# Patient Record
Sex: Male | Born: 1955
Health system: Southern US, Community
[De-identification: ages and names within clinical notes are randomized; demographics above are authoritative.]

## PROBLEM LIST (undated history)

## (undated) DIAGNOSIS — E785 Hyperlipidemia, unspecified: Secondary | ICD-10-CM

## (undated) DIAGNOSIS — D649 Anemia, unspecified: Secondary | ICD-10-CM

## (undated) DIAGNOSIS — E079 Disorder of thyroid, unspecified: Secondary | ICD-10-CM

## (undated) DIAGNOSIS — E119 Type 2 diabetes mellitus without complications: Secondary | ICD-10-CM

## (undated) DIAGNOSIS — IMO0002 Reserved for concepts with insufficient information to code with codable children: Secondary | ICD-10-CM

## (undated) DIAGNOSIS — I10 Essential (primary) hypertension: Secondary | ICD-10-CM

## (undated) DIAGNOSIS — I639 Cerebral infarction, unspecified: Secondary | ICD-10-CM

## (undated) DIAGNOSIS — K219 Gastro-esophageal reflux disease without esophagitis: Secondary | ICD-10-CM

## (undated) DIAGNOSIS — J45909 Unspecified asthma, uncomplicated: Secondary | ICD-10-CM

## (undated) DIAGNOSIS — M1990 Unspecified osteoarthritis, unspecified site: Secondary | ICD-10-CM

## (undated) HISTORY — PX: ELBOW FRACTURE SURGERY: SHX616

## (undated) HISTORY — DX: Unspecified osteoarthritis, unspecified site: M19.90

## (undated) HISTORY — DX: Disorder of thyroid, unspecified: E07.9

## (undated) HISTORY — DX: Gastro-esophageal reflux disease without esophagitis: K21.9

## (undated) HISTORY — DX: Anemia, unspecified: D64.9

## (undated) HISTORY — PX: HERNIA REPAIR: SHX51

## (undated) HISTORY — DX: Reserved for concepts with insufficient information to code with codable children: IMO0002

## (undated) HISTORY — DX: Unspecified asthma, uncomplicated: J45.909

## (undated) HISTORY — PX: JOINT REPLACEMENT: SHX530

---

## 2016-02-13 ENCOUNTER — Emergency Department (INDEPENDENT_AMBULATORY_CARE_PROVIDER_SITE_OTHER)
Admission: EM | Admit: 2016-02-13 | Discharge: 2016-02-13 | Disposition: A | Discharge status: Home / Self Care | Payer: BLUE CROSS/BLUE SHIELD | Source: Home / Self Care | Attending: Family Medicine | Admitting: Family Medicine

## 2016-02-13 ENCOUNTER — Emergency Department (INDEPENDENT_AMBULATORY_CARE_PROVIDER_SITE_OTHER): Payer: BLUE CROSS/BLUE SHIELD

## 2016-02-13 ENCOUNTER — Encounter: Payer: Self-pay | Admitting: *Deleted

## 2016-02-13 DIAGNOSIS — M25522 Pain in left elbow: Secondary | ICD-10-CM

## 2016-02-13 DIAGNOSIS — S5002XA Contusion of left elbow, initial encounter: Secondary | ICD-10-CM

## 2016-02-13 HISTORY — DX: Type 2 diabetes mellitus without complications: E11.9

## 2016-02-13 HISTORY — DX: Essential (primary) hypertension: I10

## 2016-02-13 HISTORY — DX: Cerebral infarction, unspecified: I63.9

## 2016-02-13 HISTORY — DX: Hyperlipidemia, unspecified: E78.5

## 2016-02-13 NOTE — Discharge Instructions (Signed)
Apply ice pack for 20 to 30 minutes, 3 to 4 times daily  Continue until pain decreases.  Elevate arm.  Wear ace wrap until swelling resolves.  May take Ibuprofen 200mg , 4 tabs every 8 hours with food.    Elbow Contusion An elbow contusion is a deep bruise of the elbow. Contusions are the result of an injury that caused bleeding under the skin. The contusion may turn blue, purple, or yellow. Minor injuries will give you a painless contusion, but more severe contusions may stay painful and swollen for a few weeks.  CAUSES  An elbow contusion comes from a direct force to that area, such as falling on the elbow. SYMPTOMS   Swelling and redness of the elbow.  Bruising of the elbow area.  Tenderness or soreness of the elbow. DIAGNOSIS  You will have a physical exam and will be asked about your history. You may need an X-ray of your elbow to look for a broken bone (fracture).  TREATMENT  A sling or splint may be needed to support your injury. Resting, elevating, and applying cold compresses to the elbow area are often the best treatments for an elbow contusion. Over-the-counter medicines may also be recommended for pain control. HOME CARE INSTRUCTIONS   Put ice on the injured area.  Put ice in a plastic bag.  Place a towel between your skin and the bag.  Leave the ice on for 15-20 minutes, 03-04 times a day.  Only take over-the-counter or prescription medicines for pain, discomfort, or fever as directed by your caregiver.  Rest your injured elbow until the pain and swelling are better.  Elevate your elbow to reduce swelling.  Apply a compression wrap as directed by your caregiver. This can help reduce swelling and motion. You may remove the wrap for sleeping, showers, and baths. If your fingers become numb, cold, or blue, take the wrap off and reapply it more loosely.  Use your elbow only as directed by your caregiver. You may be asked to do range of motion exercises. Do them as  directed.  See your caregiver as directed. It is very important to keep all follow-up appointments in order to avoid any long-term problems with your elbow, including chronic pain or inability to move your elbow normally. SEEK IMMEDIATE MEDICAL CARE IF:   You have increased redness, swelling, or pain in your elbow.  Your swelling or pain is not relieved with medicines.  You have swelling of the hand and fingers.  You are unable to move your fingers or wrist.  You begin to lose feeling in your hand or fingers.  Your fingers or hand become cold or blue. MAKE SURE YOU:   Understand these instructions.  Will watch your condition.  Will get help right away if you are not doing well or get worse.   This information is not intended to replace advice given to you by your health care provider. Make sure you discuss any questions you have with your health care provider.   Document Released: 08/19/2006 Document Revised: 12/03/2011 Document Reviewed: 04/25/2015 Elsevier Interactive Patient Education [employer]2016 [employer] Inc.

## 2016-02-13 NOTE — ED Provider Notes (Signed)
CSN: [id number]     Arrival date & time 02/13/16  1126 History   First MD Initiated Contact with Patient 02/13/16 1157     Chief Complaint  Patient presents with  . 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 This morning at 6:35am he slipped on water, landing on his left posterior elbow and back.  He had no immediate pain, but has now developed swelling over his posterior left elbow.  Patient is a 60 y.o. male presenting with arm injury. The history is provided by the patient and the spouse.  Arm Injury Location:  Elbow Time since incident:  5 hours Injury: yes   Mechanism of injury: fall   Fall:    Fall occurred: slipped in water.   Impact surface:  Hard floor   Point of impact: left posterior elbow. Elbow location:  L elbow Pain details:    Quality:  Aching   Radiates to:  Does not radiate   Severity:  Mild   Onset quality:  Sudden   Duration:  5 hours   Timing:  Constant   Progression:  Worsening Chronicity:  New Handedness:  Right-handed Dislocation: no   Prior injury to area:  Yes Relieved by:  Nothing Worsened by:  Movement Ineffective treatments:  None tried Associated symptoms: stiffness and swelling   Associated symptoms: no back pain, no decreased range of motion, no muscle weakness, no numbness and no tingling     Past Medical History  Diagnosis Date  . Hypertension   . Diabetes mellitus without complication (HCC)   . Stroke (HCC)   . Hyperlipidemia    Past Surgical History  Procedure Laterality Date  . Hernia repair    . Elbow fracture surgery     Family History  Problem Relation Age of Onset  . Cancer Father     lung CA   Social History  Substance Use Topics  . Smoking status: Never Smoker   . Smokeless tobacco: Never Used  . Alcohol Use: No    Review of Systems  Musculoskeletal: Positive for stiffness. Negative for back pain.  All other systems reviewed  and are negative.   Allergies  Review of patient's allergies indicates no known allergies.  Home Medications   Prior to Admission medications   Medication Sig Start Date End Date Taking? Authorizing Provider  esomeprazole (NEXIUM) 40 MG capsule Take 40 mg by mouth daily at 12 noon.   Yes Historical Provider, MD  levothyroxine (SYNTHROID, LEVOTHROID) 75 MCG tablet Take 75 mcg by mouth daily before breakfast.   Yes Historical Provider, MD  ramipril (ALTACE) 10 MG capsule Take 10 mg by mouth daily.   Yes Historical Provider, MD  simvastatin (ZOCOR) 40 MG tablet Take 40 mg by mouth daily.   Yes Historical Provider, MD  triamterene-hydrochlorothiazide (DYAZIDE) 37.5-25 MG capsule Take 1 capsule by mouth daily.   Yes Historical Provider, MD   Meds Ordered and Administered this Visit  Medications - No data to display  BP 120/79 mmHg  Pulse 81  Ht 5' 9.5" (1.765 m)  Wt 256 lb (116.121 kg)  BMI 37.28 kg/m2  SpO2 98% No data found.   Physical Exam  Constitutional: He is oriented to person, place, and time. He appears well-developed and well-nourished. No distress.  HENT:  Head: Normocephalic.  Mouth/Throat: Oropharynx is clear and moist.  Eyes: Conjunctivae are normal. Pupils are equal, round, and  reactive to light.  Musculoskeletal: Normal range of motion.       Left elbow: He exhibits swelling. He exhibits normal range of motion, no effusion, no deformity and no laceration. Tenderness found. Olecranon process tenderness noted.       Arms: There is swelling and tenderness to palpation over the left olecranon bursa.  Elbow has good range of motion. No tenderness to palpation or swelling over the radial head.  Distal neurovascular function is intact.   Neurological: He is alert and oriented to person, place, and time.  Skin: Skin is warm and dry.  Nursing note and vitals reviewed.   ED Course  Procedures none   Imaging Review Dg Elbow Complete Left  02/13/2016  CLINICAL DATA:   C/o LT posterior elbow pain and swelling after fall onto floor this morning. Hx LT elbow fx 2002. EXAM: LEFT ELBOW - COMPLETE 3+ VIEW COMPARISON:  None. FINDINGS: There is no evidence of fracture, dislocation, or joint effusion. Elbow joint replacement with prosthetic radial head and neck noted. Minimal medial compartment arthropathy. Soft tissues are unremarkable. IMPRESSION: No acute findings Electronically Signed   By: Esperanza Heiraymond  Rubner M.D.   On: 02/13/2016 12:24      MDM   1. Contusion, elbow, left, initial encounter     Apply ice pack for 20 to 30 minutes, 3 to 4 times daily  Continue until pain decreases.  Elevate arm.  Wear ace wrap until swelling resolves.  May take Ibuprofen 200mg , 4 tabs every 8 hours with food.  Followup with Dr. Rodney Langtonhomas Thekkekandam or Dr. Clementeen GrahamEvan Corey ([facility]) if not improving about two weeks.     Lattie HawStephen A Beese, MD 02/13/16 1310

## 2016-02-13 NOTE — ED Notes (Signed)
Pt fell @ home this AM, slipped on water, landing on his back and left elbow. Back currently doesn't hurt, left elbow swelling and pain. Previous injury to left elbow with prosthetic joint.

## 2018-09-02 DIAGNOSIS — Z794 Long term (current) use of insulin: Secondary | ICD-10-CM | POA: Insufficient documentation

## 2018-09-02 DIAGNOSIS — E119 Type 2 diabetes mellitus without complications: Secondary | ICD-10-CM | POA: Insufficient documentation

## 2018-09-08 DIAGNOSIS — E039 Hypothyroidism, unspecified: Secondary | ICD-10-CM | POA: Insufficient documentation

## 2018-09-08 DIAGNOSIS — I10 Essential (primary) hypertension: Secondary | ICD-10-CM | POA: Insufficient documentation

## 2018-09-19 ENCOUNTER — Encounter (HOSPITAL_BASED_OUTPATIENT_CLINIC_OR_DEPARTMENT_OTHER): Payer: Self-pay | Admitting: *Deleted

## 2018-09-19 ENCOUNTER — Emergency Department (HOSPITAL_BASED_OUTPATIENT_CLINIC_OR_DEPARTMENT_OTHER)
Admission: EM | Admit: 2018-09-19 | Discharge: 2018-09-19 | Disposition: A | Discharge status: Home / Self Care | Payer: BLUE CROSS/BLUE SHIELD | Attending: Emergency Medicine | Admitting: Emergency Medicine

## 2018-09-19 ENCOUNTER — Other Ambulatory Visit: Payer: Self-pay

## 2018-09-19 DIAGNOSIS — E119 Type 2 diabetes mellitus without complications: Secondary | ICD-10-CM | POA: Diagnosis not present

## 2018-09-19 DIAGNOSIS — Z79899 Other long term (current) drug therapy: Secondary | ICD-10-CM | POA: Diagnosis not present

## 2018-09-19 DIAGNOSIS — E785 Hyperlipidemia, unspecified: Secondary | ICD-10-CM | POA: Insufficient documentation

## 2018-09-19 DIAGNOSIS — R29810 Facial weakness: Secondary | ICD-10-CM | POA: Diagnosis present

## 2018-09-19 DIAGNOSIS — I10 Essential (primary) hypertension: Secondary | ICD-10-CM | POA: Insufficient documentation

## 2018-09-19 DIAGNOSIS — G510 Bell's palsy: Secondary | ICD-10-CM

## 2018-09-19 MED ORDER — VALACYCLOVIR HCL 500 MG PO TABS: 1000.0000 mg | ORAL_TABLET | Freq: Three times a day (TID) | ORAL | 0 refills | Status: AC

## 2018-09-19 MED ORDER — PREDNISONE 20 MG PO TABS: 60.0000 mg | ORAL_TABLET | Freq: Every day | ORAL | 0 refills | Status: DC

## 2018-09-19 MED ORDER — PREDNISONE 20 MG PO TABS: 60.0000 mg | ORAL_TABLET | Freq: Every day | ORAL | 0 refills | Status: AC

## 2018-09-19 MED ORDER — VALACYCLOVIR HCL 500 MG PO TABS: 1000.0000 mg | ORAL_TABLET | Freq: Three times a day (TID) | ORAL | 0 refills | Status: DC

## 2018-09-19 MED ORDER — PREDNISONE 50 MG PO TABS
60.0000 mg | ORAL_TABLET | Freq: Once | ORAL | Status: AC
  Administered 2018-09-19: 60 mg via ORAL
  Filled 2018-09-19: qty 1

## 2018-09-19 MED ORDER — NAPHAZOLINE-GLYCERIN 0.012-0.2 % OP SOLN
2.0000 [drp] | Freq: Four times a day (QID) | OPHTHALMIC | Status: DC | PRN
  Filled 2018-09-19: qty 15

## 2018-09-19 MED FILL — predniSONE 20 MG TABS: 20 | 7 days supply | Qty: 21 | Fill #0

## 2018-09-19 MED FILL — VALACYCLOVIR HCL 500 MG TAB: 500 | 5 days supply | Qty: 30 | Fill #0

## 2018-09-19 NOTE — ED Notes (Signed)
ED Provider at bedside. 

## 2018-09-19 NOTE — ED Triage Notes (Signed)
Unable to shut his left eye. Water dribbles out the right side of his mouth when he drinks. Recent cold. Symptoms started yesterday evening starting with left eye watering.

## 2018-09-19 NOTE — ED Notes (Signed)
Pt reports his left eye is watering constantly, denies blurred vision. Pt also reports left side of his mouth feels numb. Pt reports difficulty holding fluids in his mouth. Pt describes event "like I was at the dentist and received numbing medication". Pt neuro intact. Pt denies hx of bells palsy.

## 2018-09-19 NOTE — ED Notes (Signed)
Pt given eye patch for comfort

## 2018-09-19 NOTE — ED Provider Notes (Signed)
[facility] EMERGENCY DEPARTMENT Provider Note   CSN: [id number] Arrival date & time: 09/19/18  1319     History   Chief Complaint Chief Complaint  Patient presents with  . Facial numbness    HPI Oscar Gallagher is a 62 y.o. male.  Presents with 1 day of facial numbness and difficulty closing his eye.  Started last night when patient was sitting on his couch.  Patient recently had URI and he was getting over a cold.  Patient states that feels like "Novocain" after dental procedure in his mouth.  He is unable to hold liquids in his mouth when drinking.  He has difficulty closing his left eye.  Patient denies any type of sudden onset headache or other weakness in the area.  Patient has has past medical history significant for diabetes, hyperlipidemia, hypertension.  Patient also endorses some pain along the back of the neck.  Denies any trauma.  Denies any rash throughout his body.  Denies any chest pain, shortness of breath, vision changes, headache, fever, chills.  Patient is never had shingles before.  The history is provided by the patient.    Past Medical History:  Diagnosis Date  . Diabetes mellitus without complication (HCC)   . Hyperlipidemia   . Hypertension   . Stroke [facility](HCC)     There are no active problems to display for this patient.   Past Surgical History:  Procedure Laterality Date  . ELBOW FRACTURE SURGERY    . HERNIA REPAIR          Home Medications    Prior to Admission medications   Medication Sig Start Date End Date Taking? Authorizing Provider  esomeprazole (NEXIUM) 40 MG capsule Take 40 mg by mouth daily at 12 noon.   Yes [provider]  levothyroxine (SYNTHROID, LEVOTHROID) 75 MCG tablet Take 75 mcg by mouth daily before breakfast.   Yes [provider]  ramipril (ALTACE) 10 MG capsule Take 10 mg by mouth daily.   Yes [provider]  simvastatin (ZOCOR) 40 MG tablet Take 40 mg by mouth daily.   Yes [provider]  triamterene-hydrochlorothiazide (DYAZIDE) 37.5-25 MG capsule Take 1 capsule by mouth daily.   Yes [provider]  predniSONE (DELTASONE) 20 MG tablet Take 3 tablets (60 mg total) by mouth daily with breakfast for 7 days. 09/19/18 09/26/18  Garnette Gunnerhompson,  B, MD  valACYclovir (VALTREX) 500 MG tablet Take 2 tablets (1,000 mg total) by mouth 3 (three) times daily for 7 days. 09/19/18 09/26/18  Garnette Gunnerhompson,  B, MD    Family History Family History  Problem Relation Age of Onset  . Cancer Father        lung CA    Social History Social History   Tobacco Use  . Smoking status: Never Smoker  . Smokeless tobacco: Never Used  Substance Use Topics  . Alcohol use: No  . Drug use: No     Allergies   Patient has no known allergies.   Review of Systems Review of Systems As per HPI  Physical Exam Updated Vital Signs BP (!) 151/82 (BP Location: Right Arm)   Pulse 98   Temp 98.2 F (36.8 C) (Oral)   Resp 18   Ht 5' 9.5" (1.765 m)   Wt 110.7 kg   SpO2 98%   BMI 35.52 kg/m   Physical Exam Pulmonary:     Effort: Pulmonary effort is normal.     Breath sounds: Normal breath sounds.  Abdominal:  General: Abdomen is flat.     Palpations: Abdomen is soft.  Musculoskeletal:     Comments: 5 of 5 strength in upper and lower extremities.  Denies any decreased sensation in this as well.  Neurological:     Mental Status: He is alert and oriented to person, place, and time. Mental status is at baseline.     Comments: Patient has t reduced ability to lift left eyebrow and close left eye.  Also feels numbness inside his cheek.  Remainder of cranial nerves are intact.      ED Treatments / Results  Labs (all labs ordered are listed, but only abnormal results are displayed) Labs Reviewed - No data to display  EKG None  Radiology No results found.  Procedures Procedures (including critical care time)  Medications Ordered in ED Medications    naphazoline-glycerin (CLEAR EYES REDNESS) ophth solution 2 drop (has no administration in time range)  predniSONE (DELTASONE) tablet 60 mg (60 mg Oral Given 09/19/18 1400)     Initial Impression / Assessment and Plan / ED Course  I have reviewed the triage vital signs and the nursing notes.  Pertinent labs & imaging results that were available during my care of the patient were reviewed by me and considered in my medical decision making (see chart for details).    Patient presenting with sudden onset focal weakness of the left eyelid and some anterior facial numbness in the setting of recent viral URI.  Patient was likely has Bell's palsy.  No other cranial nerve defects seen.  Will treat with 1 week course of prednisone and valacyclovir.   Final Clinical Impressions(s) / ED Diagnoses   Final diagnoses:  Bell's palsy    ED Discharge Orders         Ordered    valACYclovir (VALTREX) 500 MG tablet  3 times daily     09/19/18 1415    predniSONE (DELTASONE) 20 MG tablet  Daily with breakfast     09/19/18 1415           Garnette Gunnerhompson,  B, MD 09/19/18 1540    Gwyneth SproutPlunkett, Whitney, MD 09/19/18 1603

## 2018-11-16 ENCOUNTER — Encounter (HOSPITAL_BASED_OUTPATIENT_CLINIC_OR_DEPARTMENT_OTHER): Payer: Self-pay | Admitting: Emergency Medicine

## 2018-11-16 ENCOUNTER — Other Ambulatory Visit: Payer: Self-pay

## 2018-11-16 ENCOUNTER — Emergency Department (HOSPITAL_BASED_OUTPATIENT_CLINIC_OR_DEPARTMENT_OTHER)
Admission: EM | Admit: 2018-11-16 | Discharge: 2018-11-16 | Disposition: A | Discharge status: Home / Self Care | Payer: BLUE CROSS/BLUE SHIELD | Attending: Emergency Medicine | Admitting: Emergency Medicine

## 2018-11-16 DIAGNOSIS — M79641 Pain in right hand: Secondary | ICD-10-CM | POA: Diagnosis not present

## 2018-11-16 DIAGNOSIS — M79642 Pain in left hand: Secondary | ICD-10-CM | POA: Diagnosis not present

## 2018-11-16 DIAGNOSIS — Z5321 Procedure and treatment not carried out due to patient leaving prior to being seen by health care provider: Secondary | ICD-10-CM | POA: Diagnosis not present

## 2018-11-16 LAB — CBG MONITORING, ED: Glucose-Capillary: 242 mg/dL — ABNORMAL HIGH (ref 70–99)

## 2018-11-16 NOTE — ED Notes (Signed)
Pt approached nurse first desk stating " I can't take no more". Pt reassured that rooms would be available soon. Pt stated "yes, but then I would have to go back there and wait another hour". Pt states he still desires to LWBS.

## 2018-11-16 NOTE — ED Triage Notes (Signed)
Bilateral hand pain x 4 weeks.

## 2018-11-21 DIAGNOSIS — M5412 Radiculopathy, cervical region: Secondary | ICD-10-CM | POA: Insufficient documentation

## 2019-02-10 HISTORY — PX: CARPAL TUNNEL RELEASE: SHX101

## 2019-07-13 DIAGNOSIS — E781 Pure hyperglyceridemia: Secondary | ICD-10-CM | POA: Insufficient documentation

## 2019-11-15 ENCOUNTER — Encounter: Payer: Self-pay | Admitting: Emergency Medicine

## 2019-11-15 ENCOUNTER — Emergency Department (INDEPENDENT_AMBULATORY_CARE_PROVIDER_SITE_OTHER)
Admission: EM | Admit: 2019-11-15 | Discharge: 2019-11-15 | Disposition: A | Discharge status: Home / Self Care | Payer: BLUE CROSS/BLUE SHIELD | Source: Home / Self Care

## 2019-11-15 ENCOUNTER — Telehealth: Payer: Self-pay | Admitting: Emergency Medicine

## 2019-11-15 ENCOUNTER — Emergency Department (INDEPENDENT_AMBULATORY_CARE_PROVIDER_SITE_OTHER): Payer: BLUE CROSS/BLUE SHIELD

## 2019-11-15 ENCOUNTER — Other Ambulatory Visit: Payer: Self-pay

## 2019-11-15 DIAGNOSIS — S59901A Unspecified injury of right elbow, initial encounter: Secondary | ICD-10-CM | POA: Diagnosis not present

## 2019-11-15 DIAGNOSIS — S5001XA Contusion of right elbow, initial encounter: Secondary | ICD-10-CM

## 2019-11-15 DIAGNOSIS — M25521 Pain in right elbow: Secondary | ICD-10-CM

## 2019-11-15 NOTE — Telephone Encounter (Signed)
Patient informed of negative elbow x-ray per Dr Hyacinth Meeker.

## 2019-11-15 NOTE — ED Provider Notes (Signed)
[facility]    CSN: [id number] Arrival date & time: 11/15/19  1240      History   Chief Complaint Chief Complaint  Patient presents with  . Arm Pain    HPI Oscar Gallagher is a 64 y.o. male.   Patient fell about 6 weeks ago striking area around his right elbow.  Has had pain since then.  He comes in today wearing a wrap on splint which he says is helpful.  HPI  Past Medical History:  Diagnosis Date  . Diabetes mellitus without complication ([place])   . Hyperlipidemia   . Hypertension   . Stroke [facility])     There are no problems to display for this patient.   Past Surgical History:  Procedure Laterality Date  . ELBOW FRACTURE SURGERY    . HERNIA REPAIR         Home Medications    Prior to Admission medications   Medication Sig Start Date End Date Taking? Authorizing Provider  esomeprazole (NEXIUM) 40 MG capsule Take 40 mg by mouth daily at 12 noon.    [provider]  levothyroxine (SYNTHROID, LEVOTHROID) 75 MCG tablet Take 75 mcg by mouth daily before breakfast.    [provider]  ramipril (ALTACE) 10 MG capsule Take 10 mg by mouth daily.    [provider]  simvastatin (ZOCOR) 40 MG tablet Take 40 mg by mouth daily.    [provider]  triamterene-hydrochlorothiazide (DYAZIDE) 37.5-25 MG capsule Take 1 capsule by mouth daily.    [provider]    Family History Family History  Problem Relation Age of Onset  . Cancer Father        lung CA    Social History Social History   Tobacco Use  . Smoking status: Never Smoker  . Smokeless tobacco: Never Used  Substance Use Topics  . Alcohol use: No  . Drug use: No     Allergies   Patient has no known allergies.   Review of Systems Review of Systems  Musculoskeletal: Positive for arthralgias.       Right elbow pain  All other systems reviewed and are negative.    Physical Exam Triage Vital Signs ED Triage Vitals [11/15/19 1344]  Enc Vitals  Group     BP 134/79     Pulse Rate 84     Resp      Temp 98.9 F (37.2 C)     Temp Source Oral     SpO2 97 %     Weight 232 lb (105.2 kg)     Height 5' 9.5" (1.765 m)     Head Circumference      Peak Flow      Pain Score 8     Pain Loc      Pain Edu?      Excl. in [place]?    No data found.  Updated Vital Signs BP 134/79 (BP Location: Left Arm)   Pulse 84   Temp 98.9 F (37.2 C) (Oral)   Ht 5' 9.5" (1.765 m)   Wt 105.2 kg   SpO2 97%   BMI 33.77 kg/m   Visual Acuity Right Eye Distance:   Left Eye Distance:   Bilateral Distance:    Right Eye Near:   Left Eye Near:    Bilateral Near:     Physical Exam Vitals and nursing note reviewed.  Constitutional:      Appearance: Normal appearance. He is obese.  Musculoskeletal:  Comments: Right elbow: Normal pronation supination Normal flexion and extension There is tenderness over the medial epicondyle  Neurological:     Mental Status: He is alert.      UC Treatments / Results  Labs (all labs ordered are listed, but only abnormal results are displayed) Labs Reviewed - No data to display  EKG X-ray of the right elbow shows no fracture  Radiology No results found.  Procedures Procedures (including critical care time)  Medications Ordered in UC Medications - No data to display  Initial Impression / Assessment and Plan / UC Course  I have reviewed the triage vital signs and the nursing notes.  Pertinent labs & imaging results that were available during my care of the patient were reviewed by me and considered in my medical decision making (see chart for details).     Contusion right elbow.  With negative x-ray also consider traumatic bursitis or tendinitis around the medial epicondyle.  I suggested that he try NSAID of choice probably Aleve on a regular basis for several weeks. Final Clinical Impressions(s) / UC Diagnoses   Final diagnoses:  Elbow pain, right   Discharge Instructions   None    ED  Prescriptions    None     PDMP not reviewed this encounter.   Frederica Kuster, MD 11/15/19 1438

## 2019-11-15 NOTE — ED Triage Notes (Signed)
Patient fell about 6 weeks ago hurt his right elbow/arm, requesting x-ray.  Been applying arthritis cream and keeping arm wrapped up.

## 2020-01-20 DIAGNOSIS — Z794 Long term (current) use of insulin: Secondary | ICD-10-CM | POA: Diagnosis not present

## 2020-01-20 DIAGNOSIS — D649 Anemia, unspecified: Secondary | ICD-10-CM | POA: Diagnosis not present

## 2020-01-20 DIAGNOSIS — E1165 Type 2 diabetes mellitus with hyperglycemia: Secondary | ICD-10-CM | POA: Diagnosis not present

## 2020-01-27 DIAGNOSIS — E1165 Type 2 diabetes mellitus with hyperglycemia: Secondary | ICD-10-CM | POA: Diagnosis not present

## 2020-01-27 DIAGNOSIS — I10 Essential (primary) hypertension: Secondary | ICD-10-CM | POA: Diagnosis not present

## 2020-01-27 DIAGNOSIS — E785 Hyperlipidemia, unspecified: Secondary | ICD-10-CM | POA: Diagnosis not present

## 2020-01-27 DIAGNOSIS — E039 Hypothyroidism, unspecified: Secondary | ICD-10-CM | POA: Diagnosis not present

## 2020-03-15 DIAGNOSIS — M7701 Medial epicondylitis, right elbow: Secondary | ICD-10-CM | POA: Diagnosis not present

## 2020-03-15 DIAGNOSIS — H538 Other visual disturbances: Secondary | ICD-10-CM | POA: Diagnosis not present

## 2020-03-15 DIAGNOSIS — R519 Headache, unspecified: Secondary | ICD-10-CM | POA: Diagnosis not present

## 2020-06-02 DIAGNOSIS — E1165 Type 2 diabetes mellitus with hyperglycemia: Secondary | ICD-10-CM | POA: Diagnosis not present

## 2020-06-02 DIAGNOSIS — Z794 Long term (current) use of insulin: Secondary | ICD-10-CM | POA: Diagnosis not present

## 2020-06-07 DIAGNOSIS — E119 Type 2 diabetes mellitus without complications: Secondary | ICD-10-CM | POA: Diagnosis not present

## 2020-06-07 DIAGNOSIS — Z23 Encounter for immunization: Secondary | ICD-10-CM | POA: Diagnosis not present

## 2020-06-07 DIAGNOSIS — L578 Other skin changes due to chronic exposure to nonionizing radiation: Secondary | ICD-10-CM | POA: Insufficient documentation

## 2020-06-07 DIAGNOSIS — N529 Male erectile dysfunction, unspecified: Secondary | ICD-10-CM | POA: Diagnosis not present

## 2020-10-04 DIAGNOSIS — U071 COVID-19: Secondary | ICD-10-CM | POA: Diagnosis not present

## 2020-10-18 DIAGNOSIS — R3912 Poor urinary stream: Secondary | ICD-10-CM | POA: Diagnosis not present

## 2020-10-18 DIAGNOSIS — U071 COVID-19: Secondary | ICD-10-CM | POA: Diagnosis not present

## 2020-10-18 DIAGNOSIS — E119 Type 2 diabetes mellitus without complications: Secondary | ICD-10-CM | POA: Diagnosis not present

## 2020-10-18 DIAGNOSIS — N401 Enlarged prostate with lower urinary tract symptoms: Secondary | ICD-10-CM | POA: Diagnosis not present

## 2021-01-26 DIAGNOSIS — E1165 Type 2 diabetes mellitus with hyperglycemia: Secondary | ICD-10-CM | POA: Diagnosis not present

## 2021-01-26 DIAGNOSIS — E119 Type 2 diabetes mellitus without complications: Secondary | ICD-10-CM | POA: Diagnosis not present

## 2021-01-26 DIAGNOSIS — R5383 Other fatigue: Secondary | ICD-10-CM | POA: Diagnosis not present

## 2021-01-26 DIAGNOSIS — Z794 Long term (current) use of insulin: Secondary | ICD-10-CM | POA: Diagnosis not present

## 2021-01-26 DIAGNOSIS — Z125 Encounter for screening for malignant neoplasm of prostate: Secondary | ICD-10-CM | POA: Diagnosis not present

## 2021-01-26 DIAGNOSIS — D649 Anemia, unspecified: Secondary | ICD-10-CM | POA: Diagnosis not present

## 2021-01-26 DIAGNOSIS — E785 Hyperlipidemia, unspecified: Secondary | ICD-10-CM | POA: Diagnosis not present

## 2021-02-02 DIAGNOSIS — K219 Gastro-esophageal reflux disease without esophagitis: Secondary | ICD-10-CM | POA: Diagnosis not present

## 2021-02-02 DIAGNOSIS — E039 Hypothyroidism, unspecified: Secondary | ICD-10-CM | POA: Diagnosis not present

## 2021-02-02 DIAGNOSIS — E119 Type 2 diabetes mellitus without complications: Secondary | ICD-10-CM | POA: Diagnosis not present

## 2021-02-02 DIAGNOSIS — N529 Male erectile dysfunction, unspecified: Secondary | ICD-10-CM | POA: Diagnosis not present

## 2021-02-23 DIAGNOSIS — R7989 Other specified abnormal findings of blood chemistry: Secondary | ICD-10-CM | POA: Diagnosis not present

## 2021-03-16 DIAGNOSIS — R7989 Other specified abnormal findings of blood chemistry: Secondary | ICD-10-CM | POA: Diagnosis not present

## 2021-03-16 DIAGNOSIS — R399 Unspecified symptoms and signs involving the genitourinary system: Secondary | ICD-10-CM | POA: Insufficient documentation

## 2021-03-16 DIAGNOSIS — R591 Generalized enlarged lymph nodes: Secondary | ICD-10-CM | POA: Diagnosis not present

## 2021-06-11 ENCOUNTER — Emergency Department (HOSPITAL_BASED_OUTPATIENT_CLINIC_OR_DEPARTMENT_OTHER)
Admission: EM | Admit: 2021-06-11 | Discharge: 2021-06-11 | Disposition: A | Discharge status: Home / Self Care | Payer: BC Managed Care – PPO | Attending: Emergency Medicine | Admitting: Emergency Medicine

## 2021-06-11 ENCOUNTER — Encounter (HOSPITAL_BASED_OUTPATIENT_CLINIC_OR_DEPARTMENT_OTHER): Payer: Self-pay | Admitting: Emergency Medicine

## 2021-06-11 DIAGNOSIS — E119 Type 2 diabetes mellitus without complications: Secondary | ICD-10-CM | POA: Diagnosis not present

## 2021-06-11 DIAGNOSIS — I10 Essential (primary) hypertension: Secondary | ICD-10-CM | POA: Diagnosis not present

## 2021-06-11 DIAGNOSIS — Z79899 Other long term (current) drug therapy: Secondary | ICD-10-CM | POA: Insufficient documentation

## 2021-06-11 DIAGNOSIS — R309 Painful micturition, unspecified: Secondary | ICD-10-CM | POA: Diagnosis not present

## 2021-06-11 DIAGNOSIS — R300 Dysuria: Secondary | ICD-10-CM | POA: Insufficient documentation

## 2021-06-11 DIAGNOSIS — R350 Frequency of micturition: Secondary | ICD-10-CM | POA: Diagnosis not present

## 2021-06-11 LAB — URINALYSIS, MICROSCOPIC (REFLEX)

## 2021-06-11 LAB — COMPREHENSIVE METABOLIC PANEL
ALT: 24 U/L (ref 0–44)
AST: 31 U/L (ref 15–41)
Albumin: 4.2 g/dL (ref 3.5–5.0)
Alkaline Phosphatase: 44 U/L (ref 38–126)
Anion gap: 8 (ref 5–15)
BUN: 18 mg/dL (ref 8–23)
CO2: 27 mmol/L (ref 22–32)
Calcium: 9.4 mg/dL (ref 8.9–10.3)
Chloride: 101 mmol/L (ref 98–111)
Creatinine, Ser: 1.27 mg/dL — ABNORMAL HIGH (ref 0.61–1.24)
GFR, Estimated: >60 mL/min (ref >60)
Glucose, Bld: 189 mg/dL — ABNORMAL HIGH (ref 70–99)
Potassium: 4.1 mmol/L (ref 3.5–5.1)
Sodium: 136 mmol/L (ref 135–145)
Total Bilirubin: 0.4 mg/dL (ref 0.3–1.2)
Total Protein: 7.7 g/dL (ref 6.5–8.1)

## 2021-06-11 LAB — URINALYSIS, ROUTINE W REFLEX MICROSCOPIC
Bilirubin Urine: NEGATIVE
Glucose, UA: >=500 mg/dL — AB
Ketones, ur: NEGATIVE mg/dL
Leukocytes,Ua: NEGATIVE
Nitrite: NEGATIVE
Protein, ur: NEGATIVE mg/dL
Specific Gravity, Urine: 1.02 (ref 1.005–1.030)
pH: 6.5 (ref 5.0–8.0)

## 2021-06-11 LAB — CBC
HCT: 42.7 % (ref 39.0–52.0)
Hemoglobin: 14.4 g/dL (ref 13.0–17.0)
MCH: 29.9 pg (ref 26.0–34.0)
MCHC: 33.7 g/dL (ref 30.0–36.0)
MCV: 88.6 fL (ref 80.0–100.0)
Platelets: 176 10*3/uL (ref 150–400)
RBC: 4.82 MIL/uL (ref 4.22–5.81)
RDW: 13.7 % (ref 11.5–15.5)
WBC: 7.7 10*3/uL (ref 4.0–10.5)
nRBC: 0 % (ref 0.0–0.2)

## 2021-06-11 MED ORDER — CEPHALEXIN 500 MG PO CAPS: 500.0000 mg | ORAL_CAPSULE | Freq: Four times a day (QID) | ORAL | 0 refills | Status: DC

## 2021-06-11 MED ORDER — TAMSULOSIN HCL 0.4 MG PO CAPS: 0.4000 mg | ORAL_CAPSULE | Freq: Every day | ORAL | 0 refills | Status: DC

## 2021-06-11 MED ORDER — CEPHALEXIN 250 MG PO CAPS
500.0000 mg | ORAL_CAPSULE | Freq: Once | ORAL | Status: AC
  Administered 2021-06-11: 500 mg via ORAL
  Filled 2021-06-11: qty 2

## 2021-06-11 NOTE — ED Triage Notes (Signed)
Pt reports dysuria and painful urination x 4 days. Denies fevers, hematuria, and N/V. Hx of same. Being follow by urology.

## 2021-06-11 NOTE — Discharge Instructions (Addendum)
Begin taking Keflex and Flomax as prescribed.  Return to the emergency department if you develop high fever, abdominal pain, inability to urinate, or other new and concerning symptoms.

## 2021-06-11 NOTE — ED Provider Notes (Signed)
[facility] EMERGENCY DEPARTMENT Provider Note   CSN: [id number] Arrival date & time: 06/11/21  2052     History Chief Complaint  Patient presents with   Dysuria    Oscar Gallagher is a 65 y.o. male.  Patient is a 65 year old male presenting with urinary frequency and burning for the past several days.  He denies any fevers or chills.  He denies any abdominal pain.  He had similar symptoms several months ago and was seen by urology.  He was told his PSA was normal and prostate seemed okay.  He was on Flomax for a short period of time which seemed to help.  The history is provided by the patient.  Dysuria Presenting symptoms: dysuria   Context: during urination   Relieved by:  Nothing Worsened by:  Nothing Ineffective treatments:  None tried     Past Medical History:  Diagnosis Date   Diabetes mellitus without complication (HCC)    Hyperlipidemia    Hypertension    Stroke (HCC)     There are no problems to display for this patient.   Past Surgical History:  Procedure Laterality Date   ELBOW FRACTURE SURGERY     HERNIA REPAIR         Family History  Problem Relation Age of Onset   Cancer Father        lung CA    Social History   Tobacco Use   Smoking status: Never   Smokeless tobacco: Never  Substance Use Topics   Alcohol use: No   Drug use: No    Home Medications Prior to Admission medications   Medication Sig Start Date End Date Taking? Authorizing Provider  esomeprazole (NEXIUM) 40 MG capsule Take 40 mg by mouth daily at 12 noon.    [provider]  levothyroxine (SYNTHROID, LEVOTHROID) 75 MCG tablet Take 75 mcg by mouth daily before breakfast.    [provider]  ramipril (ALTACE) 10 MG capsule Take 10 mg by mouth daily.    [provider]  simvastatin (ZOCOR) 40 MG tablet Take 40 mg by mouth daily.    [provider]  triamterene-hydrochlorothiazide (DYAZIDE) 37.5-25 MG capsule Take 1 capsule by  mouth daily.    [provider]    Allergies    Patient has no known allergies.  Review of Systems   Review of Systems  Genitourinary:  Positive for dysuria.  All other systems reviewed and are negative.  Physical Exam Updated Vital Signs BP (!) 149/88 (BP Location: Right Arm)   Pulse 98   Temp 98.6 F (37 C) (Oral)   Resp 18   Ht 5' 9.5" (1.765 m)   Wt 102.5 kg   SpO2 98%   BMI 32.90 kg/m   Physical Exam Vitals and nursing note reviewed.  Constitutional:      General: He is not in acute distress.    Appearance: He is well-developed. He is not diaphoretic.  HENT:     Head: Normocephalic and atraumatic.  Cardiovascular:     Rate and Rhythm: Normal rate and regular rhythm.     Heart sounds: No murmur heard.   No friction rub.  Pulmonary:     Effort: Pulmonary effort is normal. No respiratory distress.     Breath sounds: Normal breath sounds. No wheezing or rales.  Abdominal:     General: Bowel sounds are normal. There is no distension.     Palpations: Abdomen is soft.  Tenderness: There is no abdominal tenderness.  Musculoskeletal:        General: Normal range of motion.     Cervical back: Normal range of motion and neck supple.  Skin:    General: Skin is warm and dry.  Neurological:     Mental Status: He is alert and oriented to person, place, and time.     Coordination: Coordination normal.    ED Results / Procedures / Treatments   Labs (all labs ordered are listed, but only abnormal results are displayed) Labs Reviewed  COMPREHENSIVE METABOLIC PANEL - Abnormal; Notable for the following components:      Result Value   Glucose, Bld 189 (*)    Creatinine, Ser 1.27 (*)    All other components within normal limits  URINALYSIS, ROUTINE W REFLEX MICROSCOPIC - Abnormal; Notable for the following components:   Glucose, UA >=500 (*)    Hgb urine dipstick SMALL (*)    All other components within normal limits  URINALYSIS, MICROSCOPIC (REFLEX) -  Abnormal; Notable for the following components:   Bacteria, UA FEW (*)    All other components within normal limits  CBC    EKG None  Radiology No results found.  Procedures Procedures   Medications Ordered in ED Medications  cephALEXin (KEFLEX) capsule 500 mg (has no administration in time range)    ED Course  I have reviewed the triage vital signs and the nursing notes.  Pertinent labs & imaging results that were available during my care of the patient were reviewed by me and considered in my medical decision making (see chart for details).    MDM Rules/Calculators/A&P  Patient with urinary frequency.  Urinalysis shows few bacteria, but is otherwise unremarkable.  Laboratory studies are unremarkable.  Patient's abdominal exam is benign and he is well-appearing.  At this point, I feel as though discharge is appropriate.  Patient will be treated with Keflex and discharged with Keflex and Flomax.  To follow-up with urology as needed if not improving.  Final Clinical Impression(s) / ED Diagnoses Final diagnoses:  None    Rx / DC Orders ED Discharge Orders     None        Geoffery Lyons, MD 06/11/21 2310

## 2021-06-22 DIAGNOSIS — R7989 Other specified abnormal findings of blood chemistry: Secondary | ICD-10-CM | POA: Diagnosis not present

## 2021-07-12 ENCOUNTER — Encounter: Payer: Self-pay | Admitting: Family Medicine

## 2021-07-12 ENCOUNTER — Ambulatory Visit (INDEPENDENT_AMBULATORY_CARE_PROVIDER_SITE_OTHER): Payer: BC Managed Care – PPO | Admitting: Family Medicine

## 2021-07-12 VITALS — BP 138/71 | HR 88 | Temp 97.9°F | Ht 69.5 in | Wt 236.6 lb

## 2021-07-12 DIAGNOSIS — R399 Unspecified symptoms and signs involving the genitourinary system: Secondary | ICD-10-CM

## 2021-07-12 DIAGNOSIS — Z794 Long term (current) use of insulin: Secondary | ICD-10-CM

## 2021-07-12 DIAGNOSIS — E785 Hyperlipidemia, unspecified: Secondary | ICD-10-CM

## 2021-07-12 DIAGNOSIS — E119 Type 2 diabetes mellitus without complications: Secondary | ICD-10-CM

## 2021-07-12 DIAGNOSIS — I10 Essential (primary) hypertension: Secondary | ICD-10-CM

## 2021-07-12 DIAGNOSIS — K219 Gastro-esophageal reflux disease without esophagitis: Secondary | ICD-10-CM | POA: Diagnosis not present

## 2021-07-12 DIAGNOSIS — R21 Rash and other nonspecific skin eruption: Secondary | ICD-10-CM

## 2021-07-12 DIAGNOSIS — E039 Hypothyroidism, unspecified: Secondary | ICD-10-CM

## 2021-07-12 DIAGNOSIS — E1169 Type 2 diabetes mellitus with other specified complication: Secondary | ICD-10-CM

## 2021-07-12 DIAGNOSIS — Z23 Encounter for immunization: Secondary | ICD-10-CM

## 2021-07-12 DIAGNOSIS — N529 Male erectile dysfunction, unspecified: Secondary | ICD-10-CM | POA: Insufficient documentation

## 2021-07-12 LAB — POCT GLYCOSYLATED HEMOGLOBIN (HGB A1C): Hemoglobin A1C: 6.9 % — AB (ref 4.0–5.6)

## 2021-07-12 MED ORDER — PREDNISONE 10 MG (21) PO TBPK: ORAL_TABLET | ORAL | 0 refills | Status: DC

## 2021-07-12 MED ORDER — ROSUVASTATIN CALCIUM 10 MG PO TABS: 10.0000 mg | ORAL_TABLET | Freq: Every day | ORAL | 3 refills | Status: DC

## 2021-07-12 MED ORDER — NAPROXEN 500 MG PO TABS: 500.0000 mg | ORAL_TABLET | Freq: Two times a day (BID) | ORAL | 0 refills | Status: DC | PRN

## 2021-07-12 MED ORDER — FREESTYLE TEST VI STRP: ORAL_STRIP | 3 refills | Status: DC

## 2021-07-12 MED ORDER — TRIAMTERENE-HCTZ 37.5-25 MG PO CAPS: 1.0000 | ORAL_CAPSULE | Freq: Every day | ORAL | 3 refills | Status: DC

## 2021-07-12 MED ORDER — XIGDUO XR 5-1000 MG PO TB24: 2.0000 | ORAL_TABLET | Freq: Every day | ORAL | 3 refills | Status: DC

## 2021-07-12 MED ORDER — ESOMEPRAZOLE MAGNESIUM 40 MG PO CPDR: 40.0000 mg | DELAYED_RELEASE_CAPSULE | Freq: Every day | ORAL | 3 refills | Status: DC

## 2021-07-12 MED ORDER — RAMIPRIL 10 MG PO CAPS: 10.0000 mg | ORAL_CAPSULE | Freq: Every day | ORAL | 3 refills | Status: DC

## 2021-07-12 MED ORDER — SYNTHROID 100 MCG PO TABS: 100.0000 ug | ORAL_TABLET | Freq: Every day | ORAL | 3 refills | Status: DC

## 2021-07-12 MED ORDER — OZEMPIC (0.25 OR 0.5 MG/DOSE) 2 MG/1.5ML ~~LOC~~ SOPN: 0.5000 mg | PEN_INJECTOR | SUBCUTANEOUS | 2 refills | Status: AC

## 2021-07-16 DIAGNOSIS — R21 Rash and other nonspecific skin eruption: Secondary | ICD-10-CM | POA: Insufficient documentation

## 2021-07-16 DIAGNOSIS — E1169 Type 2 diabetes mellitus with other specified complication: Secondary | ICD-10-CM | POA: Insufficient documentation

## 2021-07-16 DIAGNOSIS — E785 Hyperlipidemia, unspecified: Secondary | ICD-10-CM | POA: Insufficient documentation

## 2021-07-16 NOTE — Assessment & Plan Note (Signed)
Blood pressure fairly well controlled.  He will continue current medications for management of hypertension.  Prescriptions renewed.

## 2021-07-16 NOTE — Assessment & Plan Note (Signed)
Doing well with Nexium.  Continue current strength.

## 2021-07-16 NOTE — Assessment & Plan Note (Signed)
Most recent A1c of  Lab Results  Component Value Date   HGBA1C 6.9 (A) 07/12/2021   indicates diabetes is well controlled.  he  will continue current medications for management of his diabetes..  Counseled on healthy, low carb diet and recommend frequent activity to help with maintaining good control of blood sugars.

## 2021-07-16 NOTE — Assessment & Plan Note (Signed)
Lower urinary tract symptoms of BPH.  Managed by urology.

## 2021-07-16 NOTE — Assessment & Plan Note (Signed)
Doing well with Crestor, continue current strength.

## 2021-07-16 NOTE — Progress Notes (Signed)
Oscar Gallagher - 65 y.o. male MRN [id number]  Date of birth: Oct 23, 1955  Subjective Chief Complaint  Patient presents with   Establish Care    HPI Sani is a 65 year old male here today for initial visit to establish care.  He has history of hypertension, type 2 diabetes, low testosterone, hypothyroidism, hyperlipidemia and GERD.  He reports that his previous PCP recently retired.  He is in need of several of his medications to be renewed.  Diabetes is currently managed with a combination of Xigduo and Ozempic.  He reports he is doing well with these.  Blood sugars have done fairly well with combination.  He denies any symptoms of hypoglycemia.  Blood pressure is treated with Dyazide and ramipril.  He is doing well with this.  He denies side effects.  He has not had chest pain, shortness of breath, palpitations, headache or vision changes.  Is tolerating rosuvastatin well for management of his hyperlipidemia.  He sees urology for BPH and low testosterone.  GERD remains well controlled with Nexium.  Rash in his groin area.  Reports he has been treated with a Z-Pak in the past however what he describes as it sounds more like steroids given the course that he took.  Rash is quite itchy.  He denies any changes to soaps or detergents.  ROS:  A comprehensive ROS was completed and negative except as noted per HPI  Allergies  Allergen Reactions   Aspirin Other (See Comments)    Reports a history of Peptic Ulcer Disease    Past Medical History:  Diagnosis Date   Diabetes mellitus without complication (HCC)    Hyperlipidemia    Hypertension    Stroke [facility])     Past Surgical History:  Procedure Laterality Date   ELBOW FRACTURE SURGERY     HERNIA REPAIR      Social History   Socioeconomic History   Marital status: Married    Spouse name: Not on file   Number of children: Not on file   Years of education: Not on file   Highest education level: Not on file  Occupational  History   Not on file  Tobacco Use   Smoking status: Never   Smokeless tobacco: Never  Substance and Sexual Activity   Alcohol use: No   Drug use: No   Sexual activity: Not on file  Other Topics Concern   Not on file  Social History Narrative   Not on file   Social Determinants of Health   Financial Resource Strain: Not on file  Food Insecurity: Not on file  Transportation Needs: Not on file  Physical Activity: Not on file  Stress: Not on file  Social Connections: Not on file    Family History  Problem Relation Age of Onset   Cancer Father        lung CA    Health Maintenance  Topic Date Due   FOOT EXAM  Never done   OPHTHALMOLOGY EXAM  Never done   HIV Screening  Never done   Hepatitis C Screening  Never done   COLONOSCOPY (Pts 45-66yrs Insurance coverage will need to be confirmed)  Never done   Zoster Vaccines- Shingrix (1 of 2) Never done   Pneumonia Vaccine 51+ Years old (2 - PCV) 09/25/2011   COVID-19 Vaccine (3 - Booster for Pfizer series) 02/28/2020   HEMOGLOBIN A1C  01/10/2022   TETANUS/TDAP  07/06/2026   INFLUENZA VACCINE  Completed   HPV VACCINES  Aged Out     -----------------------------------------------------------------------------------------------------------------------------------------------------------------------------------------------------------------  Physical Exam BP 138/71 (BP Location: Left Arm, Patient Position: Sitting, Cuff Size: Large)   Pulse 88   Temp 97.9 F (36.6 C)   Ht 5' 9.5" (1.765 m)   Wt 236 lb 9.6 oz (107.3 kg)   SpO2 96%   BMI 34.44 kg/m   Physical Exam Constitutional:      Appearance: Normal appearance.  HENT:     Head: Normocephalic and atraumatic.  Eyes:     General: No scleral icterus. Cardiovascular:     Rate and Rhythm: Normal rate and regular rhythm.  Pulmonary:     Effort: Pulmonary effort is normal.     Breath sounds: Normal breath sounds.  Musculoskeletal:     Cervical back: Neck supple.   Skin:    Comments: Slightly raised, papular patch along the right upper inner thigh.  Neurological:     General: No focal deficit present.     Mental Status: He is alert.  Psychiatric:        Mood and Affect: Mood normal.        Behavior: Behavior normal.    ------------------------------------------------------------------------------------------------------------------------------------------------------------------------------------------------------------------- Assessment and Plan  Essential hypertension Blood pressure fairly well controlled.  He will continue current medications for management of hypertension.  Prescriptions renewed.  GERD (gastroesophageal reflux disease) Doing well with Nexium.  Continue current strength.  Type 2 diabetes mellitus without complication, with long-term current use of insulin (HCC) Most recent A1c of  Lab Results  Component Value Date   HGBA1C 6.9 (A) 07/12/2021   indicates diabetes is well controlled.  he  will continue current medications for management of his diabetes..  Counseled on healthy, low carb diet and recommend frequent activity to help with maintaining good control of blood sugars.    Acquired hypothyroidism  With current dose of levothyroxine.  We will plan to update his TSH at next visit.  Lower urinary tract symptoms (LUTS) Lower urinary tract symptoms of BPH.  Managed by urology.  Rash Prescription for prednisone Dosepak sent in.  Hyperlipidemia associated with type 2 diabetes mellitus (HCC) Doing well with Crestor, continue current strength.   Meds ordered this encounter  Medications   XIGDUO XR 01-999 MG TB24    Sig: Take 2 tablets by mouth daily.    Dispense:  90 tablet    Refill:  3   triamterene-hydrochlorothiazide (DYAZIDE) 37.5-25 MG capsule    Sig: Take 1 each (1 capsule total) by mouth daily.    Dispense:  90 capsule    Refill:  3   SYNTHROID 100 MCG tablet    Sig: Take 1 tablet (100 mcg total) by  mouth daily.    Dispense:  90 tablet    Refill:  3   rosuvastatin (CRESTOR) 10 MG tablet    Sig: Take 1 tablet (10 mg total) by mouth at bedtime.    Dispense:  90 tablet    Refill:  3   ramipril (ALTACE) 10 MG capsule    Sig: Take 1 capsule (10 mg total) by mouth daily.    Dispense:  90 capsule    Refill:  3   OZEMPIC, 0.25 OR 0.5 MG/DOSE, 2 MG/1.5ML SOPN    Sig: Inject 0.5 mg into the skin once a week.    Dispense:  4.5 mL    Refill:  2   esomeprazole (NEXIUM) 40 MG capsule    Sig: Take 1 capsule (40 mg total) by mouth daily at 12 noon.    Dispense:  90 capsule  Refill:  3   predniSONE (STERAPRED UNI-PAK 21 TAB) 10 MG (21) TBPK tablet    Sig: Taper as directed on packaging    Dispense:  21 tablet    Refill:  0   naproxen (NAPROSYN) 500 MG tablet    Sig: Take 1 tablet (500 mg total) by mouth 2 (two) times daily as needed for moderate pain (take with food).    Dispense:  30 tablet    Refill:  0   glucose blood (FREESTYLE TEST STRIPS) test strip    Sig: TO CHECK BLOOD SUGAR DAILY.    Dispense:  100 each    Refill:  3    Return in about 3 months (around 10/12/2021) for HTN/T2DM-Labs.    This visit occurred during the SARS-CoV-2 public health emergency.  Safety protocols were in place, including screening questions prior to the visit, additional usage of staff PPE, and extensive cleaning of exam room while observing appropriate contact time as indicated for disinfecting solutions.

## 2021-07-16 NOTE — Assessment & Plan Note (Signed)
  With current dose of levothyroxine.  We will plan to update his TSH at next visit.

## 2021-07-16 NOTE — Assessment & Plan Note (Signed)
Prescription for prednisone Dosepak sent in.

## 2021-07-25 DIAGNOSIS — N400 Enlarged prostate without lower urinary tract symptoms: Secondary | ICD-10-CM | POA: Diagnosis not present

## 2021-07-25 DIAGNOSIS — R7989 Other specified abnormal findings of blood chemistry: Secondary | ICD-10-CM | POA: Diagnosis not present

## 2021-08-10 DIAGNOSIS — H43813 Vitreous degeneration, bilateral: Secondary | ICD-10-CM | POA: Diagnosis not present

## 2021-08-10 DIAGNOSIS — H2513 Age-related nuclear cataract, bilateral: Secondary | ICD-10-CM | POA: Diagnosis not present

## 2021-08-10 DIAGNOSIS — E119 Type 2 diabetes mellitus without complications: Secondary | ICD-10-CM | POA: Diagnosis not present

## 2021-08-12 ENCOUNTER — Encounter: Payer: Self-pay | Admitting: Family Medicine

## 2021-08-12 ENCOUNTER — Other Ambulatory Visit: Payer: Self-pay | Admitting: Family Medicine

## 2021-08-15 MED ORDER — NAPROXEN 500 MG PO TABS: 500.0000 mg | ORAL_TABLET | Freq: Two times a day (BID) | ORAL | 0 refills | Status: DC | PRN

## 2021-08-15 NOTE — Telephone Encounter (Signed)
Will refill x1.  He should try to limit use of this.

## 2021-08-16 NOTE — Telephone Encounter (Signed)
No additional labs needed at this time

## 2021-08-29 NOTE — Telephone Encounter (Signed)
Patient advised.

## 2021-09-21 ENCOUNTER — Encounter: Payer: Self-pay | Admitting: Family Medicine

## 2021-09-21 ENCOUNTER — Other Ambulatory Visit: Payer: Self-pay

## 2021-09-21 ENCOUNTER — Ambulatory Visit (INDEPENDENT_AMBULATORY_CARE_PROVIDER_SITE_OTHER): Payer: BC Managed Care – PPO | Admitting: Family Medicine

## 2021-09-21 VITALS — BP 126/76 | HR 87 | Temp 97.6°F | Ht 69.5 in | Wt 234.0 lb

## 2021-09-21 DIAGNOSIS — Z23 Encounter for immunization: Secondary | ICD-10-CM

## 2021-09-21 DIAGNOSIS — E119 Type 2 diabetes mellitus without complications: Secondary | ICD-10-CM

## 2021-09-21 DIAGNOSIS — I10 Essential (primary) hypertension: Secondary | ICD-10-CM | POA: Diagnosis not present

## 2021-09-21 DIAGNOSIS — Z794 Long term (current) use of insulin: Secondary | ICD-10-CM

## 2021-09-21 MED ORDER — CLOTRIMAZOLE-BETAMETHASONE 1-0.05 % EX CREA: 1.0000 "application " | TOPICAL_CREAM | Freq: Two times a day (BID) | CUTANEOUS | 1 refills | Status: DC

## 2021-09-21 MED ORDER — TIZANIDINE HCL 4 MG PO CAPS
4.0000 mg | ORAL_CAPSULE | Freq: Three times a day (TID) | ORAL | 0 refills | Status: DC | PRN
Start: 2021-09-21 — End: 2023-03-07

## 2021-09-21 NOTE — Assessment & Plan Note (Signed)
Still having some neck stiffness adding tizanidine.  Continue naproxen as needed.

## 2021-09-21 NOTE — Assessment & Plan Note (Signed)
Blood pressure remains well controlled.  Recommend continuation of current medications for management of hypertension. ?
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
## 2021-09-21 NOTE — Assessment & Plan Note (Signed)
Lab Results  Component Value Date   HGBA1C 6.9 (A) 07/12/2021  Blood sugars have been fairly well controlled with current medications.  Recommend continuation of these for now and will reassess any changes in his formulary after he transitions to Medicare.

## 2021-10-30 ENCOUNTER — Other Ambulatory Visit: Payer: Self-pay

## 2021-10-30 ENCOUNTER — Ambulatory Visit (INDEPENDENT_AMBULATORY_CARE_PROVIDER_SITE_OTHER): Payer: Medicare Other | Admitting: Family Medicine

## 2021-10-30 ENCOUNTER — Other Ambulatory Visit: Payer: Self-pay | Admitting: Family Medicine

## 2021-10-30 ENCOUNTER — Encounter: Payer: Self-pay | Admitting: Family Medicine

## 2021-10-30 VITALS — BP 138/77 | HR 87 | Ht 69.5 in | Wt 231.0 lb

## 2021-10-30 DIAGNOSIS — Z794 Long term (current) use of insulin: Secondary | ICD-10-CM | POA: Diagnosis not present

## 2021-10-30 DIAGNOSIS — M542 Cervicalgia: Secondary | ICD-10-CM

## 2021-10-30 DIAGNOSIS — E119 Type 2 diabetes mellitus without complications: Secondary | ICD-10-CM | POA: Diagnosis not present

## 2021-10-30 DIAGNOSIS — M5412 Radiculopathy, cervical region: Secondary | ICD-10-CM | POA: Diagnosis not present

## 2021-10-30 DIAGNOSIS — I10 Essential (primary) hypertension: Secondary | ICD-10-CM | POA: Diagnosis not present

## 2021-10-30 MED ORDER — TADALAFIL 5 MG PO TABS: 5.0000 mg | ORAL_TABLET | Freq: Every day | ORAL | 3 refills | Status: DC

## 2021-10-30 MED ORDER — RAMIPRIL 10 MG PO CAPS: 10.0000 mg | ORAL_CAPSULE | Freq: Every day | ORAL | 3 refills | Status: DC

## 2021-10-30 MED ORDER — FAMOTIDINE 40 MG PO TABS: 40.0000 mg | ORAL_TABLET | Freq: Every day | ORAL | 3 refills | Status: DC

## 2021-10-30 MED ORDER — TRIAMTERENE-HCTZ 37.5-25 MG PO CAPS: 1.0000 | ORAL_CAPSULE | Freq: Every day | ORAL | 3 refills | Status: DC

## 2021-10-30 MED ORDER — IBUPROFEN 800 MG PO TABS: 800.0000 mg | ORAL_TABLET | Freq: Three times a day (TID) | ORAL | 0 refills | Status: DC | PRN

## 2021-10-30 MED ORDER — FENOFIBRATE 145 MG PO TABS: 145.0000 mg | ORAL_TABLET | Freq: Every day | ORAL | 3 refills | Status: DC

## 2021-10-30 MED ORDER — OZEMPIC (0.25 OR 0.5 MG/DOSE) 2 MG/1.5ML ~~LOC~~ SOPN: PEN_INJECTOR | SUBCUTANEOUS | 0 refills | Status: DC

## 2021-10-30 NOTE — Assessment & Plan Note (Signed)
Currently taking Bydureon.  I think he would benefit better from Ozempic for better glucose control and weight management.  Will add and titrate over the next few months.

## 2021-10-30 NOTE — Assessment & Plan Note (Signed)
Blood pressure remains fairly well controlled.  Recommend continuation of current medications.
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
## 2021-10-31 ENCOUNTER — Other Ambulatory Visit: Payer: Self-pay

## 2021-10-31 DIAGNOSIS — E039 Hypothyroidism, unspecified: Secondary | ICD-10-CM

## 2021-10-31 DIAGNOSIS — E1169 Type 2 diabetes mellitus with other specified complication: Secondary | ICD-10-CM

## 2021-10-31 DIAGNOSIS — I10 Essential (primary) hypertension: Secondary | ICD-10-CM

## 2021-10-31 MED ORDER — ROSUVASTATIN CALCIUM 10 MG PO TABS: 10.0000 mg | ORAL_TABLET | Freq: Every day | ORAL | 3 refills | Status: DC

## 2021-10-31 MED ORDER — TRIAMTERENE-HCTZ 37.5-25 MG PO TABS
1.0000 | ORAL_TABLET | Freq: Every day | ORAL | 3 refills | Status: DC
Start: 2021-10-31 — End: 2022-11-09

## 2021-10-31 MED ORDER — SYNTHROID 100 MCG PO TABS
100.0000 ug | ORAL_TABLET | Freq: Every day | ORAL | 3 refills | Status: DC
Start: 2021-10-31 — End: 2022-08-27

## 2021-11-06 ENCOUNTER — Other Ambulatory Visit: Payer: Self-pay

## 2021-11-06 ENCOUNTER — Ambulatory Visit: Payer: Medicare Other | Attending: Family Medicine | Admitting: Physical Therapy

## 2021-11-06 DIAGNOSIS — M6281 Muscle weakness (generalized): Secondary | ICD-10-CM | POA: Diagnosis not present

## 2021-11-06 DIAGNOSIS — M542 Cervicalgia: Secondary | ICD-10-CM | POA: Diagnosis present

## 2021-11-06 DIAGNOSIS — R293 Abnormal posture: Secondary | ICD-10-CM | POA: Insufficient documentation

## 2021-11-06 DIAGNOSIS — Y939 Activity, unspecified: Secondary | ICD-10-CM | POA: Insufficient documentation
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
## 2021-11-15 ENCOUNTER — Ambulatory Visit: Payer: Medicare Other | Admitting: Physical Therapy

## 2021-11-15 ENCOUNTER — Other Ambulatory Visit: Payer: Self-pay

## 2021-11-15 DIAGNOSIS — R293 Abnormal posture: Secondary | ICD-10-CM

## 2021-11-15 DIAGNOSIS — M6281 Muscle weakness (generalized): Secondary | ICD-10-CM

## 2021-11-15 DIAGNOSIS — M542 Cervicalgia: Secondary | ICD-10-CM | POA: Diagnosis not present
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
## 2021-11-22 ENCOUNTER — Ambulatory Visit: Payer: Medicare Other | Admitting: Physical Therapy

## 2021-12-10 ENCOUNTER — Other Ambulatory Visit: Payer: Self-pay | Admitting: Family Medicine

## 2021-12-13 ENCOUNTER — Other Ambulatory Visit: Payer: Self-pay

## 2021-12-13 ENCOUNTER — Ambulatory Visit: Payer: Medicare Other | Attending: Family Medicine | Admitting: Physical Therapy

## 2021-12-13 DIAGNOSIS — R293 Abnormal posture: Secondary | ICD-10-CM | POA: Insufficient documentation

## 2021-12-13 DIAGNOSIS — M542 Cervicalgia: Secondary | ICD-10-CM | POA: Insufficient documentation

## 2021-12-13 DIAGNOSIS — M6281 Muscle weakness (generalized): Secondary | ICD-10-CM | POA: Diagnosis present
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
## 2021-12-22 ENCOUNTER — Encounter: Payer: No Typology Code available for payment source | Admitting: Physical Therapy

## 2021-12-27 ENCOUNTER — Ambulatory Visit: Payer: Medicare Other | Attending: Family Medicine | Admitting: Physical Therapy

## 2021-12-27 DIAGNOSIS — M542 Cervicalgia: Secondary | ICD-10-CM | POA: Diagnosis present

## 2021-12-27 DIAGNOSIS — R293 Abnormal posture: Secondary | ICD-10-CM | POA: Insufficient documentation

## 2021-12-27 DIAGNOSIS — M6281 Muscle weakness (generalized): Secondary | ICD-10-CM | POA: Diagnosis present
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
## 2022-01-15 ENCOUNTER — Other Ambulatory Visit: Payer: Self-pay | Admitting: Family Medicine

## 2022-01-16 ENCOUNTER — Other Ambulatory Visit: Payer: Self-pay

## 2022-01-16 DIAGNOSIS — E1169 Type 2 diabetes mellitus with other specified complication: Secondary | ICD-10-CM

## 2022-01-16 DIAGNOSIS — Z125 Encounter for screening for malignant neoplasm of prostate: Secondary | ICD-10-CM

## 2022-01-16 DIAGNOSIS — Z0000 Encounter for general adult medical examination without abnormal findings: Secondary | ICD-10-CM

## 2022-01-16 DIAGNOSIS — Z1159 Encounter for screening for other viral diseases: Secondary | ICD-10-CM

## 2022-01-16 DIAGNOSIS — Z114 Encounter for screening for human immunodeficiency virus [HIV]: Secondary | ICD-10-CM

## 2022-01-16 DIAGNOSIS — E119 Type 2 diabetes mellitus without complications: Secondary | ICD-10-CM

## 2022-01-16 DIAGNOSIS — E039 Hypothyroidism, unspecified: Secondary | ICD-10-CM

## 2022-01-17 MED ORDER — OZEMPIC (0.25 OR 0.5 MG/DOSE) 2 MG/3ML ~~LOC~~ SOPN: 0.5000 mg | PEN_INJECTOR | SUBCUTANEOUS | 1 refills | Status: DC

## 2022-01-18 DIAGNOSIS — Z794 Long term (current) use of insulin: Secondary | ICD-10-CM | POA: Diagnosis not present

## 2022-01-18 DIAGNOSIS — Z1159 Encounter for screening for other viral diseases: Secondary | ICD-10-CM | POA: Diagnosis not present

## 2022-01-18 DIAGNOSIS — Z0000 Encounter for general adult medical examination without abnormal findings: Secondary | ICD-10-CM | POA: Diagnosis not present

## 2022-01-18 DIAGNOSIS — E785 Hyperlipidemia, unspecified: Secondary | ICD-10-CM | POA: Diagnosis not present

## 2022-01-18 DIAGNOSIS — Z114 Encounter for screening for human immunodeficiency virus [HIV]: Secondary | ICD-10-CM | POA: Diagnosis not present

## 2022-01-18 DIAGNOSIS — E119 Type 2 diabetes mellitus without complications: Secondary | ICD-10-CM | POA: Diagnosis not present

## 2022-01-18 DIAGNOSIS — Z125 Encounter for screening for malignant neoplasm of prostate: Secondary | ICD-10-CM | POA: Diagnosis not present

## 2022-01-18 DIAGNOSIS — E039 Hypothyroidism, unspecified: Secondary | ICD-10-CM | POA: Diagnosis not present

## 2022-01-18 DIAGNOSIS — E1169 Type 2 diabetes mellitus with other specified complication: Secondary | ICD-10-CM | POA: Diagnosis not present

## 2022-01-19 LAB — HIV ANTIBODY (ROUTINE TESTING W REFLEX): HIV 1&2 Ab, 4th Generation: NONREACTIVE

## 2022-01-19 LAB — HEMOGLOBIN A1C
Hgb A1c MFr Bld: 7.2 %{Hb} — ABNORMAL HIGH
Mean Plasma Glucose: 160 mg/dL
eAG (mmol/L): 8.9 mmol/L

## 2022-01-19 LAB — LIPID PANEL
Cholesterol: 170 mg/dL (ref <200)
HDL: 37 mg/dL — ABNORMAL LOW (ref >=40)
LDL Cholesterol (Calc): 96 mg/dL (calc)
Non-HDL Cholesterol (Calc): 133 mg/dL (calc) — ABNORMAL HIGH (ref <130)
Total CHOL/HDL Ratio: 4.6 (calc) (ref <5.0)
Triglycerides: 262 mg/dL — ABNORMAL HIGH (ref <150)

## 2022-01-19 LAB — TSH: TSH: 0.4 mIU/L (ref 0.40–4.50)

## 2022-01-19 LAB — CBC
HCT: 45.9 % (ref 38.5–50.0)
Hemoglobin: 15.2 g/dL (ref 13.2–17.1)
MCH: 29.9 pg (ref 27.0–33.0)
MCHC: 33.1 g/dL (ref 32.0–36.0)
MCV: 90.2 fL (ref 80.0–100.0)
MPV: 12.1 fL (ref 7.5–12.5)
Platelets: 173 10*3/uL (ref 140–400)
RBC: 5.09 10*6/uL (ref 4.20–5.80)
RDW: 13.1 % (ref 11.0–15.0)
WBC: 3.8 10*3/uL (ref 3.8–10.8)

## 2022-01-19 LAB — HEPATITIS C ANTIBODY
Hepatitis C Ab: NONREACTIVE
SIGNAL TO CUT-OFF: 0.09 (ref <1.00)

## 2022-01-19 LAB — PSA: PSA: 0.52 ng/mL (ref <=4.00)

## 2022-01-23 ENCOUNTER — Other Ambulatory Visit: Payer: Self-pay

## 2022-01-23 MED ORDER — XIGDUO XR 5-1000 MG PO TB24
2.0000 | ORAL_TABLET | Freq: Every day | ORAL | 3 refills | Status: DC
Start: 2022-01-23 — End: 2022-01-30

## 2022-01-30 ENCOUNTER — Ambulatory Visit (INDEPENDENT_AMBULATORY_CARE_PROVIDER_SITE_OTHER): Payer: Medicare Other

## 2022-01-30 ENCOUNTER — Ambulatory Visit (INDEPENDENT_AMBULATORY_CARE_PROVIDER_SITE_OTHER): Payer: Medicare Other | Admitting: Family Medicine

## 2022-01-30 ENCOUNTER — Encounter: Payer: Self-pay | Admitting: Family Medicine

## 2022-01-30 VITALS — BP 128/72 | HR 73 | Ht 69.5 in | Wt 226.0 lb

## 2022-01-30 DIAGNOSIS — M25522 Pain in left elbow: Secondary | ICD-10-CM

## 2022-01-30 DIAGNOSIS — I10 Essential (primary) hypertension: Secondary | ICD-10-CM

## 2022-01-30 DIAGNOSIS — W19XXXA Unspecified fall, initial encounter: Secondary | ICD-10-CM

## 2022-01-30 DIAGNOSIS — E781 Pure hyperglyceridemia: Secondary | ICD-10-CM

## 2022-01-30 DIAGNOSIS — Z794 Long term (current) use of insulin: Secondary | ICD-10-CM

## 2022-01-30 DIAGNOSIS — E039 Hypothyroidism, unspecified: Secondary | ICD-10-CM

## 2022-01-30 DIAGNOSIS — E119 Type 2 diabetes mellitus without complications: Secondary | ICD-10-CM

## 2022-01-30 MED ORDER — XIGDUO XR 5-1000 MG PO TB24: 1.0000 | ORAL_TABLET | Freq: Every day | ORAL | 3 refills | Status: DC

## 2022-01-30 MED ORDER — FREESTYLE TEST VI STRP: ORAL_STRIP | 12 refills | Status: DC

## 2022-01-30 MED ORDER — FENOFIBRATE 145 MG PO TABS: 145.0000 mg | ORAL_TABLET | Freq: Every day | ORAL | 3 refills | Status: DC

## 2022-01-30 MED ORDER — FAMOTIDINE 40 MG PO TABS
40.0000 mg | ORAL_TABLET | Freq: Every day | ORAL | 3 refills | Status: DC
Start: 2022-01-30 — End: 2023-02-13

## 2022-01-30 NOTE — Assessment & Plan Note (Signed)
Lab Results  ?Component Value Date  ? HGBA1C 7.2 (H) 01/18/2022  ?A1c has increased slightly since last visit.  He has been off of a few weeks.  This is the preferred GLP-1 on his insurance.  He may be in the donut hole and I provided him with a doctor to provide for a full month of pens. ?

## 2022-01-30 NOTE — Assessment & Plan Note (Signed)
Lab Results  ?Component Value Date  ? TSH 0.40 01/18/2022  ?TSH stable at this time.  Recommend continuation of levothyroxine at current strength. ?

## 2022-01-30 NOTE — Progress Notes (Signed)
?Oscar Gallagher - 66 y.o. male MRN [id number]  Date of birth: 11/08/1955 ? ?Subjective ?No chief complaint on file. ? ? ?HPI ?Oscar Gallagher is a 66 y.o. male here today for follow up visit.  ? ?Continues on Xigduo for management of diabetes.  He was taking Ozempic as well but has not office for a couple weeks.  Reports that prescription is available at the pharmacy however is approximately $500.  He does need a renewal on his glucose strips. ? ?Triglycerides were little elevated on his recent labs.  He is taking Tricor daily in addition to Crestor.  He is tolerating this combination pretty well. ? ?Blood pressure has remained well controlled with ramipril and Maxide.  He denies chest pain, shortness of breath, palpitations, headaches or vision changes. ? ?He does have some left elbow pain.  He did have a fall previously where he landed on his elbow.  He does have a little swelling over this area. ? ?ROS:  A comprehensive ROS was completed and negative except as noted per HPI ? ?Allergies  ?Allergen Reactions  ? Aspirin Other (See Comments)  ?  Reports a history of Peptic Ulcer Disease  ? ? ?Past Medical History:  ?Diagnosis Date  ? Diabetes mellitus without complication (HCC)   ? Hyperlipidemia   ? Hypertension   ? Stroke [facility])   ? ? ?Past Surgical History:  ?Procedure Laterality Date  ? ELBOW FRACTURE SURGERY    ? HERNIA REPAIR    ? ? ?Social History  ? ?Socioeconomic History  ? Marital status: Married  ?  Spouse name: Not on file  ? Number of children: Not on file  ? Years of education: Not on file  ? Highest education level: Not on file  ?Occupational History  ? Not on file  ?Tobacco Use  ? Smoking status: Never  ? Smokeless tobacco: Never  ?Substance and Sexual Activity  ? Alcohol use: No  ? Drug use: No  ? Sexual activity: Not on file  ?Other Topics Concern  ? Not on file  ?Social History Narrative  ? Not on file  ? ?Social Determinants of Health  ? ?Financial Resource Strain: Not on file  ?Food Insecurity: Not  on file  ?Transportation Needs: Not on file  ?Physical Activity: Not on file  ?Stress: Not on file  ?Social Connections: Not on file  ? ? ?Family History  ?Problem Relation Age of Onset  ? Cancer Father   ?     lung CA  ? ? ?Health Maintenance  ?Topic Date Due  ? OPHTHALMOLOGY EXAM  Never done  ? COLONOSCOPY (Pts 45-81yrs Insurance coverage will need to be confirmed)  Never done  ? Zoster Vaccines- Shingrix (1 of 2) Never done  ? COVID-19 Vaccine (3 - Booster for Pfizer series) 02/28/2020  ? INFLUENZA VACCINE  04/24/2022  ? HEMOGLOBIN A1C  07/20/2022  ? FOOT EXAM  09/21/2022  ? TETANUS/TDAP  07/06/2026  ? Pneumonia Vaccine 45+ Years old  Completed  ? Hepatitis C Screening  Completed  ? HIV Screening  Completed  ? HPV VACCINES  Aged Out  ? ? ? ?----------------------------------------------------------------------------------------------------------------------------------------------------------------------------------------------------------------- ?Physical Exam ?BP 128/72 (BP Location: Left Arm, Patient Position: Sitting, Cuff Size: Normal)   Pulse 73   Ht 5' 9.5" (1.765 m)   Wt 226 lb (102.5 kg)   SpO2 98%   BMI 32.90 kg/m?  ? ?Physical Exam ?Constitutional:   ?   Appearance: Normal appearance.  ?Eyes:  ?   General: No  scleral icterus. ?Cardiovascular:  ?   Rate and Rhythm: Normal rate and regular rhythm.  ?Pulmonary:  ?   Effort: Pulmonary effort is normal.  ?   Breath sounds: Normal breath sounds.  ?Musculoskeletal:  ?   Cervical back: Neck supple.  ?Neurological:  ?   Mental Status: He is alert.  ?Psychiatric:     ?   Mood and Affect: Mood normal.     ?   Behavior: Behavior normal.  ? ? ?------------------------------------------------------------------------------------------------------------------------------------------------------------------------------------------------------------------- ?Assessment and Plan ? ?Type 2 diabetes mellitus without complication, with long-term current use of insulin  (HCC) ?Lab Results  ?Component Value Date  ? HGBA1C 7.2 (H) 01/18/2022  ?A1c has increased slightly since last visit.  He has been off of a few weeks.  This is the preferred GLP-1 on his insurance.  He may be in the donut hole and I provided him with a doctor to provide for a full month of pens. ? ?Acquired hypothyroidism ?Lab Results  ?Component Value Date  ? TSH 0.40 01/18/2022  ?TSH stable at this time.  Recommend continuation of levothyroxine at current strength. ? ?Essential hypertension ?Blood pressure mains well controlled with current medications.  We will plan to continue at current strength. ? ?Hypertriglyceridemia ?Triglycerides elevated on previous lab work.  He reports he was fasting.  I do think this will continue to improve as his blood sugars improved.  Encouraged dietary change.  Continue Tricor. ? ?Left elbow pain ?X-rays of the left elbow ordered. ? ? ?No orders of the defined types were placed in this encounter. ? ? ?No follow-ups on file. ? ? ? ?This visit occurred during the SARS-CoV-2 public health emergency.  Safety protocols were in place, including screening questions prior to the visit, additional usage of staff PPE, and extensive cleaning of exam room while observing appropriate contact time as indicated for disinfecting solutions.  ? ?

## 2022-01-30 NOTE — Assessment & Plan Note (Signed)
X-rays of the left elbow ordered. ?

## 2022-01-30 NOTE — Patient Instructions (Addendum)
Try voucher for Ozempic ?We'll be in touch with xray results.  ?See me again in 6 months or sooner if needed.  ? ?

## 2022-01-30 NOTE — Assessment & Plan Note (Signed)
Blood pressure mains well controlled with current medications.  We will plan to continue at current strength. ?

## 2022-01-30 NOTE — Assessment & Plan Note (Signed)
Triglycerides elevated on previous lab work.  He reports he was fasting.  I do think this will continue to improve as his blood sugars improved.  Encouraged dietary change.  Continue Tricor. ?

## 2022-02-20 ENCOUNTER — Other Ambulatory Visit: Payer: Self-pay | Admitting: Family Medicine

## 2022-03-21 ENCOUNTER — Other Ambulatory Visit: Payer: Self-pay | Admitting: Family Medicine

## 2022-04-10 ENCOUNTER — Telehealth: Payer: Self-pay

## 2022-04-10 MED ORDER — PANTOPRAZOLE SODIUM 40 MG PO TBEC: 40.0000 mg | DELAYED_RELEASE_TABLET | Freq: Every day | ORAL | 1 refills | Status: DC

## 2022-04-10 NOTE — Addendum Note (Signed)
Addended by: Mammie Lorenzo on: 04/10/2022 04:41 PM   Modules accepted: Orders

## 2022-04-10 NOTE — Telephone Encounter (Signed)
Change nexium for equivalent medication due to insurance.

## 2022-04-10 NOTE — Telephone Encounter (Signed)
Changed to protonix

## 2022-06-26 DIAGNOSIS — Z23 Encounter for immunization: Secondary | ICD-10-CM | POA: Diagnosis not present

## 2022-07-03 ENCOUNTER — Encounter: Payer: No Typology Code available for payment source | Admitting: Family Medicine

## 2022-07-11 ENCOUNTER — Encounter: Payer: No Typology Code available for payment source | Admitting: Family Medicine

## 2022-07-18 ENCOUNTER — Ambulatory Visit (INDEPENDENT_AMBULATORY_CARE_PROVIDER_SITE_OTHER): Payer: Medicare Other | Admitting: Family Medicine

## 2022-07-18 ENCOUNTER — Encounter: Payer: Self-pay | Admitting: Family Medicine

## 2022-07-18 VITALS — BP 139/78 | HR 75 | Ht 69.5 in | Wt 226.0 lb

## 2022-07-18 DIAGNOSIS — Z794 Long term (current) use of insulin: Secondary | ICD-10-CM

## 2022-07-18 DIAGNOSIS — Z0000 Encounter for general adult medical examination without abnormal findings: Secondary | ICD-10-CM | POA: Diagnosis not present

## 2022-07-18 DIAGNOSIS — E119 Type 2 diabetes mellitus without complications: Secondary | ICD-10-CM | POA: Diagnosis not present

## 2022-07-18 DIAGNOSIS — Z1211 Encounter for screening for malignant neoplasm of colon: Secondary | ICD-10-CM | POA: Diagnosis not present

## 2022-07-18 LAB — POCT GLYCOSYLATED HEMOGLOBIN (HGB A1C): HbA1c, POC (controlled diabetic range): 7.4 % — AB (ref 0.0–7.0)

## 2022-07-18 NOTE — Patient Instructions (Signed)
  Mr. Oscar Gallagher , Thank you for taking time to come for your Medicare Wellness Visit. I appreciate your ongoing commitment to your health goals. Please review the following plan we discussed and let me know if I can assist you in the future.   These are the goals we discussed:  Goals   None     This is a list of the screening recommended for you and due dates:  Health Maintenance  Topic Date Due   Medicare Annual Wellness Visit  Never done   Yearly kidney function blood test for diabetes  06/11/2022   Eye exam for diabetics  07/18/2022*   COVID-19 Vaccine (3 - Pfizer series) 08/03/2022*   Zoster (Shingles) Vaccine (1 of 2) 10/18/2022*   Yearly kidney health urinalysis for diabetes  10/25/2022*   Colon Cancer Screening  01/31/2023*   Complete foot exam   09/21/2022   Hemoglobin A1C  01/17/2023   Tetanus Vaccine  07/06/2026   Pneumonia Vaccine  Completed   Flu Shot  Completed   Hepatitis C Screening: USPSTF Recommendation to screen - Ages 18-79 yo.  Completed   HPV Vaccine  Aged Out  *Topic was postponed. The date shown is not the original due date.

## 2022-07-18 NOTE — Progress Notes (Signed)
Medicare Annual Wellness Visit  07/18/2022  Subjective:  Oscar Gallagher is a 66 y.o. male primary care patient of Everrett Coombe, DO who had a Medicare Annual Wellness Visit today via telephone. Fortino is Retired and lives with their family. Quasim has 2 children. he reports that he is socially active and does interact with friends/family regularly. he is minimally physically active and enjoys fishing.  Patient Care Team: Everrett Coombe, DO as PCP - General (Family Medicine)     11/06/2021    1:50 PM 07/12/2021    3:43 PM 11/16/2018    6:11 PM 09/19/2018    1:24 PM  Advanced Directives  Does Patient Have a Medical Advance Directive? No No No   Would patient like information on creating a medical advance directive? No - Patient declined No - Patient declined       Information is confidential and restricted. Go to Review Flowsheets to unlock data.    Hospital Utilization Over the Past 12 Months: # of hospitalizations or ER visits: 0 # of surgeries: 0  Review of Systems    Patient reports that his overall health is unchanged compared to last year.  Review of Systems  Constitutional:  Negative for chills, fever, malaise/fatigue and weight loss.  HENT:  Negative for congestion, ear pain and sore throat.   Eyes:  Negative for blurred vision, double vision and pain.  Respiratory:  Negative for cough and shortness of breath.   Cardiovascular:  Negative for chest pain and palpitations.  Gastrointestinal:  Negative for abdominal pain, blood in stool, constipation, heartburn and nausea.  Genitourinary:  Negative for dysuria and urgency.  Musculoskeletal:  Negative for joint pain and myalgias.  Neurological:  Negative for dizziness and headaches.  Endo/Heme/Allergies:  Does not bruise/bleed easily.  Psychiatric/Behavioral:  Negative for depression. The patient is not nervous/anxious and does not have insomnia.      Pain Assessment Pain Score: 3      Current Medications &  Allergies (verified) Allergies as of 07/18/2022       Reactions   Aspirin Other (See Comments)   Reports a history of Peptic Ulcer Disease        Medication List        Accurate as of July 18, 2022 11:06 PM. If you have any questions, ask your nurse or doctor.          STOP taking these medications    Ozempic (0.25 or 0.5 MG/DOSE) 2 MG/3ML Sopn Generic drug: Semaglutide(0.25 or 0.5MG /DOS) Stopped by: Everrett Coombe, DO       TAKE these medications    clotrimazole-betamethasone cream Commonly known as: LOTRISONE Apply 1 application topically 2 (two) times daily.   famotidine 40 MG tablet Commonly known as: PEPCID Take 1 tablet (40 mg total) by mouth daily.   fenofibrate 145 MG tablet Commonly known as: TRICOR Take 1 tablet (145 mg total) by mouth daily.   FREESTYLE TEST STRIPS test strip Generic drug: glucose blood Use as instructed   ibuprofen 800 MG tablet Commonly known as: ADVIL TAKE 1 TABLET BY MOUTH EVERY 8 HOURS AS NEEDED   pantoprazole 40 MG tablet Commonly known as: PROTONIX Take 1 tablet (40 mg total) by mouth daily.   ramipril 10 MG capsule Commonly known as: ALTACE Take 1 capsule (10 mg total) by mouth daily. What changed: when to take this   rosuvastatin 10 MG tablet Commonly known as: CRESTOR Take 1 tablet (10 mg total) by mouth at bedtime.   Synthroid  100 MCG tablet Generic drug: levothyroxine Take 1 tablet (100 mcg total) by mouth daily.   tadalafil 5 MG tablet Commonly known as: CIALIS Take 1 tablet (5 mg total) by mouth daily.   tamsulosin 0.4 MG Caps capsule Commonly known as: Flomax Take 1 capsule (0.4 mg total) by mouth daily.   tiZANidine 4 MG capsule Commonly known as: ZANAFLEX Take 1 capsule (4 mg total) by mouth 3 (three) times daily as needed for muscle spasms.   triamterene-hydrochlorothiazide 37.5-25 MG tablet Commonly known as: MAXZIDE-25 Take 1 tablet by mouth daily.   Xigduo XR 01-999 MG Tb24 Generic  drug: Dapagliflozin Pro-metFORMIN ER Take 1 tablet by mouth daily. What changed:  how much to take when to take this        History (reviewed): Past Medical History:  Diagnosis Date   Diabetes mellitus without complication (HCC)    Hyperlipidemia    Hypertension    Stroke [facility])    Past Surgical History:  Procedure Laterality Date   ELBOW FRACTURE SURGERY     HERNIA REPAIR     Family History  Problem Relation Age of Onset   Cancer Father        lung CA   Social History   Socioeconomic History   Marital status: Married    Spouse name: Not on file   Number of children: Not on file   Years of education: Not on file   Highest education level: Not on file  Occupational History   Not on file  Tobacco Use   Smoking status: Never   Smokeless tobacco: Never  Substance and Sexual Activity   Alcohol use: No   Drug use: No   Sexual activity: Not on file  Other Topics Concern   Not on file  Social History Narrative   Not on file   Social Determinants of Health   Financial Resource Strain: Not on file  Food Insecurity: Not on file  Transportation Needs: Not on file  Physical Activity: Not on file  Stress: Not on file  Social Connections: Not on file    Activities of Daily Living    07/18/2022   11:03 PM  In your present state of health, do you have any difficulty performing the following activities:  Hearing? 1  Vision? 0  Difficulty concentrating or making decisions? 0  Walking or climbing stairs? 0  Dressing or bathing? 0  Doing errands, shopping? 0       Diet Patient reports consuming 3 meals a day and 1 snack(s) a day Patient reports that his primary diet is: Regular Patient reports that she does have regular access to food.   Depression Screen    07/18/2022   11:09 AM 01/30/2022    9:07 AM 10/30/2021    9:31 AM 09/21/2021   10:50 AM 07/12/2021    3:26 PM  PHQ 2/9 Scores  PHQ - 2 Score 0 0 0 0 0  PHQ- 9 Score     5     Fall Risk     07/18/2022   11:09 AM 01/30/2022    9:07 AM 09/21/2021   10:50 AM 07/12/2021    3:26 PM  Fall Risk   Falls in the past year? 0 1 0 0  Number falls in past yr: 0 0 0 0  Injury with Fall? 0 1 0 0  Risk for fall due to : No Fall Risks No Fall Risks No Fall Risks No Fall Risks  Follow up Falls evaluation  completed Falls evaluation completed Falls evaluation completed Falls evaluation completed       Objective:    Mr. Ahr was alert and able to participate appropriately during our visit.   Today's Vitals   07/18/22 1102  BP: 139/78  Pulse: 75  SpO2: 100%  Weight: 226 lb (102.5 kg)  Height: 5' 9.5" (1.765 m)  PainSc: 3   PainLoc: Neck   Body mass index is 32.9 kg/m.     11/06/2021    1:50 PM 07/12/2021    3:43 PM 11/16/2018    6:11 PM 09/19/2018    1:24 PM  Advanced Directives  Does Patient Have a Medical Advance Directive? No No No   Would patient like information on creating a medical advance directive? No - Patient declined No - Patient declined       Information is confidential and restricted. Go to Review Flowsheets to unlock data.    Hearing/Vision  Normand did not seem to have difficulty with hearing/understanding during the face to face visit Reports that he has had a formal eye exam by an eye care professional within the past year Reports that he has not had a formal hearing evaluation within the past year  Cognitive Function:    07/18/2022   11:10 AM  6CIT Screen  What Year? 0 points  What month? 0 points  What time? 0 points  Count back from 20 0 points  Months in reverse 0 points  Repeat phrase 4 points  Total Score 4 points   (Normal:0-7, Significant for Dysfunction: >8)  Normal Cognitive Function Screening: Yes      No data to display           Immunization & Health Maintenance Record Immunization History  Administered Date(s) Administered   Fluad Quad(high Dose 65+) 07/12/2021, 06/24/2022   Influenza,inj,Quad PF,6+ Mos 05/29/2018,  06/07/2020   Influenza-Unspecified 06/24/2014, 06/25/2015, 07/06/2016, 06/11/2017, 07/14/2019, 07/15/2019   PFIZER(Purple Top)SARS-COV-2 Vaccination 12/13/2019, 01/03/2020   PNEUMOCOCCAL CONJUGATE-20 09/21/2021   Pneumococcal Polysaccharide-23 09/24/2010   Tdap 11/08/2014, 07/06/2016    Health Maintenance  Topic Date Due   Medicare Annual Wellness (AWV)  Never done   Diabetic kidney evaluation - GFR measurement  06/11/2022   OPHTHALMOLOGY EXAM  07/18/2022 (Originally 02/14/1966)   COVID-19 Vaccine (3 - Pfizer series) 08/03/2022 (Originally 02/28/2020)   Zoster Vaccines- Shingrix (1 of 2) 10/18/2022 (Originally 02/14/2006)   Diabetic kidney evaluation - Urine ACR  10/25/2022 (Originally 08/26/2020)   COLONOSCOPY (Pts 45-69yrs Insurance coverage will need to be confirmed)  01/31/2023 (Originally 02/14/2001)   FOOT EXAM  09/21/2022   HEMOGLOBIN A1C  01/17/2023   TETANUS/TDAP  07/06/2026   Pneumonia Vaccine 90+ Years old  Completed   INFLUENZA VACCINE  Completed   Hepatitis C Screening  Completed   HPV VACCINES  Aged Out       Assessment:  This is a routine wellness examination for [place] Maintenance: Due or [place] Maintenance Due  Topic Date Due   Medicare Annual Wellness (AWV)  Never done   Diabetic kidney evaluation - GFR measurement  06/11/2022    Lear Ng does not need a referral for Community Assistance: Care Management:   no Social Work:    no Prescription Assistance:  no Nutrition/Diabetes Education:  no   Plan:  Personalized Goals  Goals Addressed   None    Personalized Health Maintenance & Screening Recommendations  Colorectal cancer screening  Lung Cancer Screening Recommended: no (Low Dose CT Chest recommended if  Age 71-80 years, 30 pack-year currently smoking OR have quit w/in past 15 years) Hepatitis C Screening recommended: no HIV Screening recommended: no  Advanced Directives: Written information was prepared per patient's  request.  Referrals & Orders Orders Placed This Encounter  Procedures   Cologuard   POCT HgB A1C   EKG 12-Lead    Follow-up Plan Follow-up with Everrett Coombe, DO as planned    I have personally reviewed and noted the following in the patient's chart:   Medical and social history Use of alcohol, tobacco or illicit drugs  Current medications and supplements Functional ability and status Nutritional status Physical activity Advanced directives List of other physicians Hospitalizations, surgeries, and ER visits in previous 12 months Vitals Screenings to include cognitive, depression, and falls Referrals and appointments  In addition, I have reviewed and discussed with [facility] certain preventive protocols, quality metrics, and best practice recommendations. A written personalized care plan for preventive services as well as general preventive health recommendations is available and can be mailed to the patient at his request.    EKG completed today as part of welcome to Medicare exam.  This showed normal sinus rhythm.  Everrett Coombe  07/18/2022

## 2022-07-31 ENCOUNTER — Emergency Department (HOSPITAL_BASED_OUTPATIENT_CLINIC_OR_DEPARTMENT_OTHER): Payer: Medicare Other

## 2022-07-31 ENCOUNTER — Emergency Department (HOSPITAL_BASED_OUTPATIENT_CLINIC_OR_DEPARTMENT_OTHER)
Admission: EM | Admit: 2022-07-31 | Discharge: 2022-07-31 | Disposition: A | Discharge status: Home / Self Care | Payer: Medicare Other | Attending: Emergency Medicine | Admitting: Emergency Medicine

## 2022-07-31 ENCOUNTER — Other Ambulatory Visit: Payer: Self-pay

## 2022-07-31 ENCOUNTER — Encounter (HOSPITAL_BASED_OUTPATIENT_CLINIC_OR_DEPARTMENT_OTHER): Payer: Self-pay | Admitting: Pediatrics

## 2022-07-31 DIAGNOSIS — E119 Type 2 diabetes mellitus without complications: Secondary | ICD-10-CM | POA: Diagnosis not present

## 2022-07-31 DIAGNOSIS — Z79899 Other long term (current) drug therapy: Secondary | ICD-10-CM | POA: Diagnosis not present

## 2022-07-31 DIAGNOSIS — M461 Sacroiliitis, not elsewhere classified: Secondary | ICD-10-CM | POA: Insufficient documentation

## 2022-07-31 DIAGNOSIS — Z7984 Long term (current) use of oral hypoglycemic drugs: Secondary | ICD-10-CM | POA: Diagnosis not present

## 2022-07-31 DIAGNOSIS — M5136 Other intervertebral disc degeneration, lumbar region: Secondary | ICD-10-CM | POA: Diagnosis not present

## 2022-07-31 DIAGNOSIS — M48061 Spinal stenosis, lumbar region without neurogenic claudication: Secondary | ICD-10-CM | POA: Diagnosis not present

## 2022-07-31 DIAGNOSIS — I10 Essential (primary) hypertension: Secondary | ICD-10-CM | POA: Insufficient documentation

## 2022-07-31 DIAGNOSIS — M47818 Spondylosis without myelopathy or radiculopathy, sacral and sacrococcygeal region: Secondary | ICD-10-CM

## 2022-07-31 DIAGNOSIS — M5126 Other intervertebral disc displacement, lumbar region: Secondary | ICD-10-CM | POA: Diagnosis not present

## 2022-07-31 DIAGNOSIS — N200 Calculus of kidney: Secondary | ICD-10-CM | POA: Diagnosis not present

## 2022-07-31 DIAGNOSIS — M4699 Unspecified inflammatory spondylopathy, multiple sites in spine: Secondary | ICD-10-CM | POA: Diagnosis not present

## 2022-07-31 DIAGNOSIS — R109 Unspecified abdominal pain: Secondary | ICD-10-CM | POA: Diagnosis not present

## 2022-07-31 DIAGNOSIS — M545 Low back pain, unspecified: Secondary | ICD-10-CM | POA: Diagnosis not present

## 2022-07-31 LAB — BASIC METABOLIC PANEL
Anion gap: 11 (ref 5–15)
BUN: 18 mg/dL (ref 8–23)
CO2: 26 mmol/L (ref 22–32)
Calcium: 9.1 mg/dL (ref 8.9–10.3)
Chloride: 99 mmol/L (ref 98–111)
Creatinine, Ser: 1.19 mg/dL (ref 0.61–1.24)
GFR, Estimated: >60 mL/min (ref >60)
Glucose, Bld: 131 mg/dL — ABNORMAL HIGH (ref 70–99)
Potassium: 4.1 mmol/L (ref 3.5–5.1)
Sodium: 136 mmol/L (ref 135–145)

## 2022-07-31 LAB — CBC
HCT: 45.8 % (ref 39.0–52.0)
Hemoglobin: 15.3 g/dL (ref 13.0–17.0)
MCH: 29.6 pg (ref 26.0–34.0)
MCHC: 33.4 g/dL (ref 30.0–36.0)
MCV: 88.6 fL (ref 80.0–100.0)
Platelets: 161 10*3/uL (ref 150–400)
RBC: 5.17 MIL/uL (ref 4.22–5.81)
RDW: 12.7 % (ref 11.5–15.5)
WBC: 6.1 10*3/uL (ref 4.0–10.5)
nRBC: 0 % (ref 0.0–0.2)

## 2022-07-31 LAB — URINALYSIS, MICROSCOPIC (REFLEX)

## 2022-07-31 LAB — URINALYSIS, ROUTINE W REFLEX MICROSCOPIC
Bilirubin Urine: NEGATIVE
Glucose, UA: >=500 mg/dL — AB
Ketones, ur: 15 mg/dL — AB
Leukocytes,Ua: NEGATIVE
Nitrite: NEGATIVE
Protein, ur: NEGATIVE mg/dL
Specific Gravity, Urine: 1.015 (ref 1.005–1.030)
pH: 5.5 (ref 5.0–8.0)

## 2022-07-31 MED ORDER — ACETAMINOPHEN 500 MG PO TABS: 1000.0000 mg | ORAL_TABLET | Freq: Once | ORAL | Status: DC

## 2022-07-31 MED ORDER — LIDOCAINE 5 % EX PTCH: 1.0000 | MEDICATED_PATCH | CUTANEOUS | 0 refills | Status: DC

## 2022-07-31 MED ORDER — DIAZEPAM 5 MG/ML IJ SOLN
2.5000 mg | Freq: Once | INTRAMUSCULAR | Status: AC
  Administered 2022-07-31: 2.5 mg via INTRAVENOUS
  Filled 2022-07-31: qty 2

## 2022-07-31 MED ORDER — LIDOCAINE 5 % EX PTCH
1.0000 | MEDICATED_PATCH | CUTANEOUS | Status: DC
  Administered 2022-07-31: 1 via TRANSDERMAL
  Filled 2022-07-31: qty 1

## 2022-07-31 MED ORDER — DIAZEPAM 5 MG PO TABS: 5.0000 mg | ORAL_TABLET | Freq: Three times a day (TID) | ORAL | 0 refills | Status: DC | PRN

## 2022-07-31 MED ORDER — DIAZEPAM 5 MG/ML IJ SOLN: 2.5000 mg | Freq: Once | INTRAMUSCULAR | Status: DC

## 2022-07-31 MED ORDER — KETOROLAC TROMETHAMINE 30 MG/ML IJ SOLN
15.0000 mg | Freq: Once | INTRAMUSCULAR | Status: AC
  Administered 2022-07-31: 15 mg via INTRAVENOUS
  Filled 2022-07-31: qty 1

## 2022-07-31 MED ORDER — NAPROXEN 500 MG PO TABS: 500.0000 mg | ORAL_TABLET | Freq: Two times a day (BID) | ORAL | 0 refills | Status: DC

## 2022-07-31 MED ORDER — OXYCODONE HCL 5 MG PO TABS
5.0000 mg | ORAL_TABLET | Freq: Once | ORAL | Status: AC
  Administered 2022-07-31: 5 mg via ORAL
  Filled 2022-07-31: qty 1

## 2022-07-31 NOTE — ED Provider Notes (Signed)
[facility] EMERGENCY DEPARTMENT Provider Note   CSN: [id number] Arrival date & time: 07/31/22  1020     History  Chief Complaint  Patient presents with   Hip Pain    [place] Fant is a 66 y.o. male. With pmh DM2, HTN, HLD, GERD, sciatica who presents with with right back and buttock pain.  He said for the past couple of days he has had persistent throbbing pain in his right back and buttock region.  It does not radiate down his leg.  He has had no recent falls or injuries.  He notes a history of sciatica but this feels like the worst the pain has ever been.  He has taken Aleve with some relief.  He denies any history of cancer or IV drug use.  He denies any loss of bladder or bowels he has had no difficulty going to the bathroom, no saddle anesthesia, no paresthesias, no loss of sensation, no weakness.  He says the pain is worse with positional change.  He has some relief when lying down.  He has had no fevers, no vomiting.  Some nausea from pain.  He has history of kidney stones but does not feel like this is similar as he has had no hematuria, no dysuria and no change in urinary habits.   Hip Pain       Home Medications Prior to Admission medications   Medication Sig Start Date End Date Taking? Authorizing Provider  diazepam (VALIUM) 5 MG tablet Take 1 tablet (5 mg total) by mouth every 8 (eight) hours as needed for up to 10 doses for anxiety. 07/31/22  Yes Mardene Sayer, MD  lidocaine (LIDODERM) 5 % Place 1 patch onto the skin daily. Remove & Discard patch within 12 hours or as directed by MD 07/31/22  Yes Mardene Sayer, MD  naproxen (NAPROSYN) 500 MG tablet Take 1 tablet (500 mg total) by mouth 2 (two) times daily. 07/31/22  Yes Mardene Sayer, MD  clotrimazole-betamethasone (LOTRISONE) cream Apply 1 application topically 2 (two) times daily. 09/21/21   Everrett Coombe, DO  famotidine (PEPCID) 40 MG tablet Take 1 tablet (40 mg total) by mouth daily. 01/30/22  04/30/22  Everrett Coombe, DO  fenofibrate (TRICOR) 145 MG tablet Take 1 tablet (145 mg total) by mouth daily. 01/30/22 04/30/22  Everrett Coombe, DO  glucose blood (FREESTYLE TEST STRIPS) test strip Use as instructed 01/30/22   Everrett Coombe, DO  ibuprofen (ADVIL) 800 MG tablet TAKE 1 TABLET BY MOUTH EVERY 8 HOURS AS NEEDED 02/21/22   Everrett Coombe, DO  pantoprazole (PROTONIX) 40 MG tablet Take 1 tablet (40 mg total) by mouth daily. 04/10/22   Everrett Coombe, DO  ramipril (ALTACE) 10 MG capsule Take 1 capsule (10 mg total) by mouth daily. Patient taking differently: Take 10 mg by mouth 2 (two) times daily. 10/30/21   Everrett Coombe, DO  rosuvastatin (CRESTOR) 10 MG tablet Take 1 tablet (10 mg total) by mouth at bedtime. 10/31/21   Everrett Coombe, DO  SYNTHROID 100 MCG tablet Take 1 tablet (100 mcg total) by mouth daily. 10/31/21   Everrett Coombe, DO  tadalafil (CIALIS) 5 MG tablet Take 1 tablet (5 mg total) by mouth daily. 10/30/21   Everrett Coombe, DO  tamsulosin (FLOMAX) 0.4 MG CAPS capsule Take 1 capsule (0.4 mg total) by mouth daily. 06/11/21   Geoffery Lyons, MD  tiZANidine (ZANAFLEX) 4 MG capsule Take 1 capsule (4 mg total) by mouth 3 (three) times daily as  needed for muscle spasms. 09/21/21   Everrett Coombe, DO  triamterene-hydrochlorothiazide (MAXZIDE-25) 37.5-25 MG tablet Take 1 tablet by mouth daily. 10/31/21   Everrett Coombe, DO  XIGDUO XR 01-999 MG TB24 Take 1 tablet by mouth daily. Patient taking differently: Take 2 tablets by mouth in the morning and at bedtime. 01/30/22   Everrett Coombe, DO      Allergies    Aspirin    Review of Systems   Review of Systems  Physical Exam Updated Vital Signs BP (!) 153/90 (BP Location: Right Arm)   Pulse 88   Temp 98.2 F (36.8 C) (Oral)   Resp 18   Ht 5\' 10"  (1.778 m)   Wt 102.5 kg   SpO2 96%   BMI 32.43 kg/m  Physical Exam Constitutional: Alert and oriented.  Slightly uncomfortable lying on his left side in the bed holding onto his right lower back but  no acute distress Eyes: Conjunctivae are normal. ENT      Head: Normocephalic and atraumatic. Cardiovascular: S1, S2, equal palpable PT pulses.Warm and well perfused. Respiratory: Normal respiratory effort. Breath sounds are normal.  O2 sat 96 on RA Gastrointestinal: Soft and nontender. There is no CVA tenderness. Musculoskeletal: Normal range of motion in all extremities.  Right lumbar paraspinal tenderness to palpation with no external evidence of injury, no ecchymoses, no edema, no skin change.  No midline tenderness step-offs or deformities of the T and L-spine.      Right lower leg: No tenderness or edema.      Left lower leg: No tenderness or edema. Neurologic: Normal speech and language.  Sensation grossly intact throughout bilateral lower extremities.  5 out of 5 strength bilateral foot dorsiflexion/plantarflexion, knee flexion and extension and hip flexion.  Steady ambulatory gait.  No gross focal neurologic deficits are appreciated. Skin: Skin is warm, dry and intact. No rash noted. Psychiatric: Mood and affect are normal. Speech and behavior are normal.  ED Results / Procedures / Treatments   Labs (all labs ordered are listed, but only abnormal results are displayed) Labs Reviewed  URINALYSIS, ROUTINE W REFLEX MICROSCOPIC - Abnormal; Notable for the following components:      Result Value   Glucose, UA >=500 (*)    Hgb urine dipstick SMALL (*)    Ketones, ur 15 (*)    All other components within normal limits  URINALYSIS, MICROSCOPIC (REFLEX) - Abnormal; Notable for the following components:   Bacteria, UA RARE (*)    All other components within normal limits  BASIC METABOLIC PANEL - Abnormal; Notable for the following components:   Glucose, Bld 131 (*)    All other components within normal limits  URINE CULTURE  CBC    EKG None  Radiology CT L-SPINE NO CHARGE  Result Date: 07/31/2022 CLINICAL DATA:  Low back pain.  Right buttock and hip pain. EXAM: CT LUMBAR SPINE  WITHOUT CONTRAST TECHNIQUE: Multidetector CT imaging of the lumbar spine was performed without intravenous contrast administration. Multiplanar CT image reconstructions were also generated. RADIATION DOSE REDUCTION: This exam was performed according to the departmental dose-optimization program which includes automated exposure control, adjustment of the mA and/or kV according to patient size and/or use of iterative reconstruction technique. COMPARISON:  CT abdomen same day FINDINGS: Segmentation: Five lumbar type vertebral bodies. Alignment: 1 mm degenerative anterolisthesis L5-S1 which could worsen with standing or flexion. Vertebrae: No fracture or focal bone lesion. Paraspinal and other soft tissues: 2 mm nonobstructing stone in the lower pole of the  left kidney. Disc levels: T11-12: Minimal disc bulge. Ligamentous calcification. No compressive stenosis. T12-L1: Minimal disc bulge. Ligamentous calcification. No compressive stenosis. L1-2: Mild bulging of the disc.  No stenosis. L2-3: Mild bulging of the disc. Mild facet and ligamentous hypertrophy. Mild canal narrowing but without visible neural compression. L3-4: Mild bulging of the disc. Mild facet and ligamentous hypertrophy. Mild canal narrowing but without visible neural compression. L4-5: Protrusion of the disc more prominent in the left posterolateral direction. Facet and ligamentous hypertrophy. Multifactorial stenosis at this level that could cause neural compression, seemingly more likely on the left than the right. The facet arthritis could be a cause of back pain or referred facet syndrome pain. L5-S1: Chronic facet arthropathy with 1 mm of anterolisthesis. This could worsen with standing or flexion. Bulging of the disc. No central canal stenosis. Mild narrowing of the subarticular lateral recesses. Foraminal stenosis right worse than left with some potential to affect the L5 nerves. In addition, the facet arthritis could be a cause of back pain or  referred facet syndrome pain. Mild sacroiliac osteoarthritis is present. IMPRESSION: 1. L4-5: Protrusion of the disc more prominent in the left posterolateral direction. Facet and ligamentous hypertrophy. Multifactorial stenosis at this level that could cause neural compression, seemingly more likely on the left than the right. The facet arthritis could be a cause of back pain or referred facet syndrome pain. 2. L5-S1: Chronic facet arthropathy with 1 mm of anterolisthesis. This could worsen with standing or flexion. Bulging of the disc. No central canal stenosis. Mild narrowing of the subarticular lateral recesses. Foraminal stenosis right worse than left with some potential to affect the L5 nerves. In addition, the facet arthritis could be a cause of back pain or referred facet syndrome pain. 3. Mild sacroiliac osteoarthritis. 4. 2 mm nonobstructing stone in the lower pole of the left kidney. Electronically Signed   By: Paulina FusiMark  Shogry M.D.   On: 07/31/2022 13:24   CT Renal Stone Study  Result Date: 07/31/2022 CLINICAL DATA:  Right sided back and abdominal pain. Symptoms began 4 days ago. EXAM: CT ABDOMEN AND PELVIS WITHOUT CONTRAST TECHNIQUE: Multidetector CT imaging of the abdomen and pelvis was performed following the standard protocol without IV contrast. RADIATION DOSE REDUCTION: This exam was performed according to the departmental dose-optimization program which includes automated exposure control, adjustment of the mA and/or kV according to patient size and/or use of iterative reconstruction technique. COMPARISON:  None Available. FINDINGS: Lower chest: Lung bases are clear except for minimal scarring at the left base. No pleural fluid. Hepatobiliary: Liver appears normal without contrast. No calcified gallstones. Pancreas: Normal Spleen: Normal Adrenals/Urinary Tract: Adrenal glands are normal. Kidneys are normal. No mass, stone or hydronephrosis on the right. 2 mm nonobstructing stone in the lower pole  of the left kidney. No stone in the bladder or proximal urethra. Stomach/Bowel: Stomach and small intestine are normal. Normal appendix. Normal colon. Vascular/Lymphatic: Aorta and IVC are normal.  No adenopathy. Reproductive: Normal Other: No free fluid or air. Musculoskeletal: See results of lumbar CT. No evidence of regional fracture or focal bone lesion. Facet arthropathy in the lower lumbar spine, particularly at L5-S1. Disc bulge at L4-5. Hip region appears normal. IMPRESSION: 1. No acute or significant abdominal or pelvic finding. No evidence of right renal stone disease. 2 mm nonobstructing stone in the lower pole of the left kidney. 2. Facet arthropathy in the lower lumbar spine, particularly at L5-S1. Disc bulge at L4-5. Electronically Signed   By: Loraine LericheMark  Shogry M.D.   On: 07/31/2022 13:14    Procedures Procedures    Medications Ordered in ED Medications  lidocaine (LIDODERM) 5 % 1 patch (1 patch Transdermal Patch Applied 07/31/22 1318)  ketorolac (TORADOL) 30 MG/ML injection 15 mg (15 mg Intravenous Given 07/31/22 1314)  diazepam (VALIUM) injection 2.5 mg (2.5 mg Intravenous Given 07/31/22 1317)    ED Course/ Medical Decision Making/ A&P Clinical Course as of 07/31/22 1414  Tue Jul 31, 2022  1411 Reassessed patient who has had improvement in pain control since receiving medications here.  I advised continued supportive care and follow-up with PCP for referral to PT and continue gentle exercises.  I will also provide referral to neurosurgery for outpatient follow-up.  Provided patient with as needed Valium and advised continued Tylenol and ibuprofen with lidocaine patches. [VB]  1412   Strict return precautions discussed. [VB]    Clinical Course User Index [VB] Mardene Sayer, MD                           Medical Decision Making Trevian Hayashida is a 66 y.o. male. With pmh DM2, HTN, HLD, GERD, sciatica who presents with with right back and buttock pain.    Evaluation of the  patient's back pain by history and physical exam reveals:   - Duration < 6 weeks without rapid progression of symptoms - Absence of traumatic event, even minor trauma - Absence of systemic symptoms including fevers, chills, weight loss - Absence of recent bacterial infection - Absence of unremitting pain or pain at night with rest - Absence of bilateral symptoms - Absence of numbness, weakness, difficulty walking, urinary retention or bowel incontinence - Absence of personal history of cancer, immunosuppression, diabetes, known AAA, or renal colic - Absence of personal history of IV drug use.   - Absence of point tenderness over vertebral bodies - Absence of pulsatile abdominal mass - Symmetric and intact lower extremity strength, sensation, absence of clonus - 2+ symmetric PT pulses   Based on history and physical, I have a very low suspicion of a concerning etiology of pain including epidural compression syndrome, spinal infection, transverse myelitis, malignancy, abdominal aortic aneurysm, renal colic, acute lower extremity claudication, neurogenic claudication, ankylosing spondylitis, or other intra-abdominal process.  Basic labs were obtained which were unremarkable.  Normal white blood cell count 6.1.  UA with small hemoglobin but no nitrites, no leukocyte esterase, no WBCs, no concern for pyelonephritis.  CT renal stone protocol and CT L-spine obtained which showed left renal stone within the lower pole of kidney not causing patient's pain as well as evidence of facet arthropathy in lower lumbar spine with disc bulge at L4-L5 which is likely underlying cause of his pain. I personally reviewed CT and saw no hydronephrosis or evidence of actively passing stone.   Plan to manage conservatively with outpatient analgesia, analgesia, and physical therapy.   - Acetaminophen 650 q 4 +  naproxen prn - Diazepam 5 q 6 PRN for muscle relaxation x 10 - Continue normal daily activities as  tolerated by pain - Provide with standard musculoskeletal back pain exercise instructions - Instruct to follow up with primary care provider if symptoms not improving - Provide careful return precautions and concerning symptoms to watch for.   Amount and/or Complexity of Data Reviewed Labs: ordered. Radiology: ordered.  Risk Prescription drug management.    Final Clinical Impression(s) / ED Diagnoses Final diagnoses:  Arthritis of sacroiliac joint  Bulging lumbar disc  Spinal stenosis of lumbar region without neurogenic claudication    Rx / DC Orders ED Discharge Orders          Ordered    lidocaine (LIDODERM) 5 %  Every 24 hours        07/31/22 1414    naproxen (NAPROSYN) 500 MG tablet  2 times daily        07/31/22 1414    diazepam (VALIUM) 5 MG tablet  Every 8 hours PRN        07/31/22 1414              Elgie Congo, MD 07/31/22 1414

## 2022-07-31 NOTE — ED Triage Notes (Signed)
C/O right upper buttocks / hip pain since Friday; reports hx of sciatic pain as well as concern for kidney stone; denies painful urination.

## 2022-07-31 NOTE — ED Notes (Signed)
Pt requesting more pain meds before d/c

## 2022-07-31 NOTE — Discharge Instructions (Addendum)
You were seen in the emergency department today for back pain.  Your back pain is likely due to degenerative disc disease and a bulging disc that could be seen in your lower lumbar spine.  Continue gentle exercises with instructions in your discharge paperwork.  Take the Naprosyn 2 times a day with Tylenol 650 mg every 4 hours and apply lidocaine patches daily.  If pain is still severe you can take a Valium as needed for spasm relief.  Do not take while driving a car or operating heavy machinery.  Follow-up with your primary care doctor regarding your visit to the emergency department today.  Make sure to make an appointment with physical therapy and you can see the neurosurgery and spine doctors as needed regarding your symptoms.  Come back if you have any severe worsening uncontrolled pain, new weakness of the legs, loss of sensation, loss of control of your bladder or bowels or any other symptoms concerning to you.

## 2022-08-01 ENCOUNTER — Telehealth: Payer: Self-pay | Admitting: General Practice

## 2022-08-01 LAB — URINE CULTURE: Culture: NO GROWTH

## 2022-08-01 NOTE — Telephone Encounter (Signed)
Transition Care Management Follow-up Telephone Call Date of discharge and from where: 07/31/22 from [facility] med center How have you been since you were released from the hospital? Still in a lot of hip pain. Has not tried the patches yet but will try them. Has an appt already scheduled to see PCP on 08/06/22. Any questions or concerns? No  Items Reviewed: Did the pt receive and understand the discharge instructions provided? Yes  Medications obtained and verified? Yes  Other? No  Any new allergies since your discharge? No  Dietary orders reviewed? Yes Do you have support at home? Yes   Home Care and Equipment/Supplies: Were home health services ordered? no   Functional Questionnaire: (I = Independent and D = Dependent) ADLs: I  Bathing/Dressing- I  Meal Prep- I  Eating- I  Maintaining continence- I  Transferring/Ambulation- I  Managing Meds- I  Follow up appointments reviewed:  PCP Hospital f/u appt confirmed? Yes. Has an appt with Dr. Ashley Royalty on 08/06/22. Specialist Hospital f/u appt confirmed? No.  Are transportation arrangements needed? No  If their condition worsens, is the pt aware to call PCP or go to the Emergency Dept.? Yes Was the patient provided with contact information for the PCP's office or ED? Yes Was to pt encouraged to call back with questions or concerns? Yes

## 2022-08-06 ENCOUNTER — Encounter: Payer: Self-pay | Admitting: Family Medicine

## 2022-08-06 ENCOUNTER — Ambulatory Visit (INDEPENDENT_AMBULATORY_CARE_PROVIDER_SITE_OTHER): Payer: Medicare Other | Admitting: Family Medicine

## 2022-08-06 VITALS — BP 150/77 | HR 76 | Ht 70.0 in | Wt 226.0 lb

## 2022-08-06 DIAGNOSIS — M5412 Radiculopathy, cervical region: Secondary | ICD-10-CM

## 2022-08-06 DIAGNOSIS — Z23 Encounter for immunization: Secondary | ICD-10-CM | POA: Diagnosis not present

## 2022-08-06 DIAGNOSIS — I10 Essential (primary) hypertension: Secondary | ICD-10-CM

## 2022-08-06 DIAGNOSIS — E119 Type 2 diabetes mellitus without complications: Secondary | ICD-10-CM

## 2022-08-06 DIAGNOSIS — Z794 Long term (current) use of insulin: Secondary | ICD-10-CM

## 2022-08-06 DIAGNOSIS — G8929 Other chronic pain: Secondary | ICD-10-CM

## 2022-08-06 DIAGNOSIS — M25511 Pain in right shoulder: Secondary | ICD-10-CM

## 2022-08-06 DIAGNOSIS — M542 Cervicalgia: Secondary | ICD-10-CM

## 2022-08-06 DIAGNOSIS — M5441 Lumbago with sciatica, right side: Secondary | ICD-10-CM

## 2022-08-06 MED ORDER — RYBELSUS 7 MG PO TABS: 7.0000 mg | ORAL_TABLET | Freq: Every day | ORAL | 0 refills | Status: DC

## 2022-08-06 MED ORDER — RYBELSUS 3 MG PO TABS: 3.0000 mg | ORAL_TABLET | Freq: Every day | ORAL | 0 refills | Status: DC

## 2022-08-06 MED ORDER — PREDNISONE 50 MG PO TABS: ORAL_TABLET | ORAL | 0 refills | Status: DC

## 2022-08-06 MED ORDER — RAMIPRIL 10 MG PO CAPS: 10.0000 mg | ORAL_CAPSULE | Freq: Two times a day (BID) | ORAL | 3 refills | Status: DC

## 2022-08-06 NOTE — Assessment & Plan Note (Signed)
Blood pressure is elevated today.  Recommend increasing ramipril to twice daily dosing.  Updated prescription sent in.  Return in 2 weeks for blood pressure recheck.

## 2022-08-06 NOTE — Assessment & Plan Note (Signed)
Continues to have symptoms related to cervical radiculopathy with radiation to the right shoulder.  Adding on some additional physical therapy, MRI of the neck ordered.

## 2022-08-06 NOTE — Patient Instructions (Addendum)
Let's try rybelsus.  Start 30 days at 3mg  then increase to 7mg .  Return in 2 weeks for nurse visit to recheck BP.     Sciatica  Sciatica is pain, weakness, tingling, or loss of feeling (numbness) along the sciatic nerve. The sciatic nerve starts in the lower back and goes down the back of each leg. Sciatica usually affects one side of the body. Sciatica usually goes away on its own or with treatment. Sometimes, sciatica may come back. What are the causes? This condition happens when the sciatic nerve is pinched or has pressure put on it. This may be caused by: A disk in between the bones of the spine bulging out too far (herniated disk). Changes in the spinal disks due to aging. A condition that affects a muscle in the butt. Extra bone growth near the sciatic nerve. A break (fracture) of the area between your hip bones (pelvis). Pregnancy. Tumor. This is rare. What increases the risk? You are more likely to develop this condition if you: Play sports that put pressure or stress on the spine. Have poor strength and ease of movement (flexibility). Have had a back injury or back surgery. Sit for long periods of time. Do activities that involve bending or lifting over and over again. Are very overweight (obese). What are the signs or symptoms? Symptoms can vary from mild to very bad. They may include: Any of these problems in the lower back, leg, hip, or butt: Mild tingling, loss of feeling, or dull aches. A burning feeling. Sharp pains. Loss of feeling in the back of the calf or the sole of the foot. Leg weakness. Very bad back pain that makes it hard to move. These symptoms may get worse when you cough, sneeze, or laugh. They may also get worse when you sit or stand for long periods of time. How is this treated? This condition often gets better without any treatment. However, treatment may include: Changing or cutting back on physical activity when you have pain. Exercising,  including strengthening and stretching. Putting ice or heat on the affected area. Shots of medicines to relieve pain and swelling or to relax your muscles. Surgery. Follow these instructions at home: Medicines Take over-the-counter and prescription medicines only as told by your doctor. Ask your doctor if you should avoid driving or using machines while you are taking your medicine. Managing pain     If told, put ice on the affected area. To do this: Put ice in a plastic bag. Place a towel between your skin and the bag. Leave the ice on for 20 minutes, 2-3 times a day. If your skin turns bright red, take off the ice right away to prevent skin damage. The risk of skin damage is higher if you cannot feel pain, heat, or cold. If told, put heat on the affected area. Do this as often as told by your doctor. Use the heat source that your doctor tells you to use, such as a moist heat pack or a heating pad. Place a towel between your skin and the heat source. Leave the heat on for 20-30 minutes. If your skin turns bright red, take off the heat right away to prevent burns. The risk of burns is higher if you cannot feel pain, heat, or cold. Activity  Return to your normal activities when your doctor says that it is safe. Avoid activities that make your symptoms worse. Take short rests during the day. When you rest for a long time,  do some physical activity or stretching between periods of rest. Avoid sitting for a long time without moving. Get up and move around at least one time each hour. Do exercises and stretches as told by your doctor. Do not lift anything that is heavier than 10 lb (4.5 kg). Avoid lifting heavy things even when you do not have symptoms. Avoid lifting heavy things over and over. When you lift objects, always lift in a way that is safe for your body. To do this, you should: Bend your knees. Keep the object close to your body. Avoid twisting. General instructions Stay  at a healthy weight. Wear comfortable shoes that support your feet. Avoid wearing high heels. Avoid sleeping on a mattress that is too soft or too hard. You might have less pain if you sleep on a mattress that is firm enough to support your back. Contact a doctor if: Your pain is not controlled by medicine. Your pain does not get better. Your pain gets worse. Your pain lasts longer than 4 weeks. You lose weight without trying. Get help right away if: You cannot control when you pee (urinate) or poop (have a bowel movement). You have weakness in any of these areas and it gets worse: Lower back. The area between your hip bones. Butt. Legs. You have redness or swelling of your back. You have a burning feeling when you pee. Summary Sciatica is pain, weakness, tingling, or loss of feeling (numbness) along the sciatic nerve. This may include the lower back, legs, hips, and butt. This condition happens when the sciatic nerve is pinched or has pressure put on it. Treatment often includes rest, exercise, medicines, and putting ice or heat on the affected area. This information is not intended to replace advice given to you by your health care provider. Make sure you discuss any questions you have with your health care provider. Document Revised: 12/18/2021 Document Reviewed: 12/18/2021 Elsevier Patient Education  2023 [employer].
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
## 2022-08-06 NOTE — Assessment & Plan Note (Signed)
Adding prednisone burst.  Referral placed to physical therapy.

## 2022-08-06 NOTE — Assessment & Plan Note (Signed)
He did well with Ozempic however cost was too much.  Adding Rybelsus daily.  We will initially start with 3 mg dose with increase to 7 mg after 1 month.  Provided with 30-day sample of 3 mg strength.
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
## 2022-08-09 ENCOUNTER — Other Ambulatory Visit: Payer: Self-pay

## 2022-08-09 ENCOUNTER — Encounter: Payer: Self-pay | Admitting: Rehabilitative and Restorative Service Providers"

## 2022-08-09 ENCOUNTER — Ambulatory Visit: Payer: Medicare Other | Attending: Family Medicine | Admitting: Rehabilitative and Restorative Service Providers"

## 2022-08-09 DIAGNOSIS — M542 Cervicalgia: Secondary | ICD-10-CM | POA: Diagnosis not present

## 2022-08-09 DIAGNOSIS — H2513 Age-related nuclear cataract, bilateral: Secondary | ICD-10-CM | POA: Diagnosis not present

## 2022-08-09 DIAGNOSIS — H43813 Vitreous degeneration, bilateral: Secondary | ICD-10-CM | POA: Diagnosis not present

## 2022-08-09 DIAGNOSIS — G8929 Other chronic pain: Secondary | ICD-10-CM | POA: Diagnosis not present

## 2022-08-09 DIAGNOSIS — M25511 Pain in right shoulder: Secondary | ICD-10-CM | POA: Insufficient documentation

## 2022-08-09 DIAGNOSIS — E113293 Type 2 diabetes mellitus with mild nonproliferative diabetic retinopathy without macular edema, bilateral: Secondary | ICD-10-CM | POA: Diagnosis not present

## 2022-08-09 DIAGNOSIS — R293 Abnormal posture: Secondary | ICD-10-CM

## 2022-08-09 DIAGNOSIS — M25551 Pain in right hip: Secondary | ICD-10-CM

## 2022-08-09 DIAGNOSIS — H04123 Dry eye syndrome of bilateral lacrimal glands: Secondary | ICD-10-CM | POA: Diagnosis not present

## 2022-08-09 DIAGNOSIS — M6281 Muscle weakness (generalized): Secondary | ICD-10-CM

## 2022-08-09 DIAGNOSIS — H524 Presbyopia: Secondary | ICD-10-CM | POA: Diagnosis not present

## 2022-08-09 LAB — HM DIABETES EYE EXAM

## 2022-08-13 ENCOUNTER — Ambulatory Visit (INDEPENDENT_AMBULATORY_CARE_PROVIDER_SITE_OTHER): Payer: Medicare Other

## 2022-08-13 DIAGNOSIS — M542 Cervicalgia: Secondary | ICD-10-CM | POA: Diagnosis not present

## 2022-08-13 DIAGNOSIS — M25511 Pain in right shoulder: Secondary | ICD-10-CM

## 2022-08-13 DIAGNOSIS — G8929 Other chronic pain: Secondary | ICD-10-CM

## 2022-08-14 ENCOUNTER — Other Ambulatory Visit: Payer: Self-pay

## 2022-08-14 MED ORDER — XIGDUO XR 5-1000 MG PO TB24: 2.0000 | ORAL_TABLET | Freq: Two times a day (BID) | ORAL | 2 refills | Status: DC

## 2022-08-20 ENCOUNTER — Ambulatory Visit: Payer: Medicare Other | Admitting: Rehabilitative and Restorative Service Providers"

## 2022-08-20 ENCOUNTER — Encounter: Payer: Self-pay | Admitting: Rehabilitative and Restorative Service Providers"

## 2022-08-20 ENCOUNTER — Ambulatory Visit: Payer: No Typology Code available for payment source

## 2022-08-20 DIAGNOSIS — M25551 Pain in right hip: Secondary | ICD-10-CM

## 2022-08-20 DIAGNOSIS — M542 Cervicalgia: Secondary | ICD-10-CM

## 2022-08-20 DIAGNOSIS — M25511 Pain in right shoulder: Secondary | ICD-10-CM | POA: Diagnosis not present

## 2022-08-20 DIAGNOSIS — M6281 Muscle weakness (generalized): Secondary | ICD-10-CM

## 2022-08-20 DIAGNOSIS — R293 Abnormal posture: Secondary | ICD-10-CM
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
## 2022-08-22 ENCOUNTER — Ambulatory Visit (INDEPENDENT_AMBULATORY_CARE_PROVIDER_SITE_OTHER): Payer: Medicare Other | Admitting: Family Medicine

## 2022-08-22 VITALS — BP 132/71 | HR 73 | Resp 20

## 2022-08-22 DIAGNOSIS — I10 Essential (primary) hypertension: Secondary | ICD-10-CM | POA: Diagnosis not present

## 2022-08-22 NOTE — Progress Notes (Signed)
   Subjective:    Patient ID: Oscar Gallagher, male    DOB: [birth year], 66 y.o.   MRN: [id number]  HPI  Patient is here for blood pressure. Denies trouble sleeping, palpitations or medication problems.  Review of Systems     Objective:   Physical Exam        Assessment & Plan:   Patient had questions and concerns about his MRI results and was advised to schedule an in office or virtual appointment with Dr. Ashley Royalty to discuss if he would like to see a neurosurgeon or try an injection around that area and see if that helps.

## 2022-08-22 NOTE — Progress Notes (Signed)
Medical screening examination/treatment was performed by qualified clinical staff member and as supervising physician I was immediately available for consultation/collaboration. I have reviewed documentation and agree with assessment and plan.  Anice Wilshire, DO  

## 2022-08-23 ENCOUNTER — Encounter: Payer: Self-pay | Admitting: Rehabilitative and Restorative Service Providers"

## 2022-08-23 ENCOUNTER — Ambulatory Visit: Payer: Medicare Other | Admitting: Rehabilitative and Restorative Service Providers"

## 2022-08-23 DIAGNOSIS — M25551 Pain in right hip: Secondary | ICD-10-CM

## 2022-08-23 DIAGNOSIS — M25511 Pain in right shoulder: Secondary | ICD-10-CM | POA: Diagnosis not present

## 2022-08-23 DIAGNOSIS — M542 Cervicalgia: Secondary | ICD-10-CM

## 2022-08-23 DIAGNOSIS — R293 Abnormal posture: Secondary | ICD-10-CM

## 2022-08-23 DIAGNOSIS — M6281 Muscle weakness (generalized): Secondary | ICD-10-CM
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
## 2022-08-24 DIAGNOSIS — Z1211 Encounter for screening for malignant neoplasm of colon: Secondary | ICD-10-CM | POA: Diagnosis not present

## 2022-08-25 ENCOUNTER — Other Ambulatory Visit: Payer: Self-pay | Admitting: Family Medicine

## 2022-08-25 DIAGNOSIS — E039 Hypothyroidism, unspecified: Secondary | ICD-10-CM

## 2022-08-27 ENCOUNTER — Encounter: Payer: Medicare Other | Admitting: Rehabilitative and Restorative Service Providers"

## 2022-08-28 ENCOUNTER — Telehealth (INDEPENDENT_AMBULATORY_CARE_PROVIDER_SITE_OTHER): Payer: Medicare Other | Admitting: Family Medicine

## 2022-08-28 ENCOUNTER — Encounter: Payer: Self-pay | Admitting: Family Medicine

## 2022-08-28 VITALS — Ht 70.0 in | Wt 226.0 lb

## 2022-08-28 DIAGNOSIS — M542 Cervicalgia: Secondary | ICD-10-CM

## 2022-08-28 DIAGNOSIS — M5412 Radiculopathy, cervical region: Secondary | ICD-10-CM | POA: Diagnosis not present

## 2022-08-28 NOTE — Assessment & Plan Note (Signed)
We discussed referral to neurosurgery versus going ahead and ordering an ESI.  He would like to have him neurosurgery consultation initially.  Referral placed.

## 2022-08-28 NOTE — Progress Notes (Signed)
Oscar Gallagher - 66 y.o. male MRN [id number]  Date of birth: 11-07-1955   This visit type was conducted due to national recommendations for restrictions regarding the COVID-19 Pandemic (e.g. social distancing).  This format is felt to be most appropriate for this patient at this time.  All issues noted in this document were discussed and addressed.  No physical exam was performed (except for noted visual exam findings with Video Visits).  I discussed the limitations of evaluation and management by telemedicine and the availability of in person appointments. The patient expressed understanding and agreed to proceed.  I connected withNAME@ on 08/28/22 at  1:50 PM EST by a video enabled telemedicine application and verified that I am speaking with the correct person using two identifiers.  Present at visit: Everrett Coombe, DO [facility]   Patient Location: Home [address] [place] [address]   Provider location:   [facility]  Chief Complaint  Patient presents with   MRI results    HPI  Oscar Gallagher is a 66 y.o. male who presents via audio/video conferencing for a telehealth visit today.  He is following up today for neck pain.  Continues have pain with radiation into the right shoulder and arm.  Denies weakness of the arm.  Recent MRI with foraminal stenosis at c5-c6.   ROS:  A comprehensive ROS was completed and negative except as noted per HPI  Past Medical History:  Diagnosis Date   Diabetes mellitus without complication (HCC)    Hyperlipidemia    Hypertension    Stroke [facility])     Past Surgical History:  Procedure Laterality Date   CARPAL TUNNEL RELEASE Right 02/10/2019   ELBOW FRACTURE SURGERY Left    Prosthesis on radial bone   HERNIA REPAIR      Family History  Problem Relation Age of Onset   Cancer Father        lung CA    Social History   Socioeconomic History   Marital status: Married    Spouse name: Not on file   Number of children: Not on  file   Years of education: Not on file   Highest education level: Not on file  Occupational History   Not on file  Tobacco Use   Smoking status: Never   Smokeless tobacco: Never  Substance and Sexual Activity   Alcohol use: No   Drug use: No   Sexual activity: Not on file  Other Topics Concern   Not on file  Social History Narrative   Not on file   Social Determinants of Health   Financial Resource Strain: Not on file  Food Insecurity: Not on file  Transportation Needs: Not on file  Physical Activity: Not on file  Stress: Not on file  Social Connections: Not on file  Intimate Partner Violence: Not on file     Current Outpatient Medications:    clotrimazole-betamethasone (LOTRISONE) cream, Apply 1 application topically 2 (two) times daily., Disp: 90 g, Rfl: 1   diazepam (VALIUM) 5 MG tablet, Take 1 tablet (5 mg total) by mouth every 8 (eight) hours as needed for up to 10 doses for anxiety., Disp: 10 tablet, Rfl: 0   glucose blood (FREESTYLE TEST STRIPS) test strip, Use as instructed, Disp: 100 each, Rfl: 12   ibuprofen (ADVIL) 800 MG tablet, TAKE 1 TABLET BY MOUTH EVERY 8 HOURS AS NEEDED, Disp: 90 tablet, Rfl: 0   lidocaine (LIDODERM) 5 %, Place 1 patch onto the skin daily. Remove &  Discard patch within 12 hours or as directed by MD, Disp: 30 patch, Rfl: 0   naproxen (NAPROSYN) 500 MG tablet, Take 1 tablet (500 mg total) by mouth 2 (two) times daily., Disp: 30 tablet, Rfl: 0   pantoprazole (PROTONIX) 40 MG tablet, Take 1 tablet (40 mg total) by mouth daily., Disp: 90 tablet, Rfl: 1   ramipril (ALTACE) 10 MG capsule, Take 1 capsule (10 mg total) by mouth 2 (two) times daily., Disp: 180 capsule, Rfl: 3   rosuvastatin (CRESTOR) 10 MG tablet, Take 1 tablet (10 mg total) by mouth at bedtime., Disp: 90 tablet, Rfl: 3   Semaglutide (RYBELSUS) 3 MG TABS, Take 3 mg by mouth daily., Disp: 30 tablet, Rfl: 0   Semaglutide (RYBELSUS) 7 MG TABS, Take 7 mg by mouth daily., Disp: 90 tablet,  Rfl: 0   SYNTHROID 100 MCG tablet, TAKE 1 TABLET BY MOUTH EVERY DAY, Disp: 90 tablet, Rfl: 3   tadalafil (CIALIS) 5 MG tablet, Take 1 tablet (5 mg total) by mouth daily., Disp: 90 tablet, Rfl: 3   tamsulosin (FLOMAX) 0.4 MG CAPS capsule, Take 1 capsule (0.4 mg total) by mouth daily., Disp: 30 capsule, Rfl: 0   tiZANidine (ZANAFLEX) 4 MG capsule, Take 1 capsule (4 mg total) by mouth 3 (three) times daily as needed for muscle spasms., Disp: 60 capsule, Rfl: 0   triamterene-hydrochlorothiazide (MAXZIDE-25) 37.5-25 MG tablet, Take 1 tablet by mouth daily., Disp: 90 tablet, Rfl: 3   XIGDUO XR 01-999 MG TB24, Take 2 tablets by mouth in the morning and at bedtime., Disp: 60 tablet, Rfl: 2   famotidine (PEPCID) 40 MG tablet, Take 1 tablet (40 mg total) by mouth daily., Disp: 90 tablet, Rfl: 3   fenofibrate (TRICOR) 145 MG tablet, Take 1 tablet (145 mg total) by mouth daily., Disp: 90 tablet, Rfl: 3  EXAM:  VITALS per patient if applicable: Ht 5\' 10"  (1.778 m)   Wt 226 lb 0.6 oz (102.5 kg)   BMI 32.43 kg/m   GENERAL: alert, oriented, appears well and in no acute distress  HEENT: atraumatic, conjunttiva clear, no obvious abnormalities on inspection of external nose and ears  NECK: normal movements of the head and neck  LUNGS: on inspection no signs of respiratory distress, breathing rate appears normal, no obvious gross SOB, gasping or wheezing  CV: no obvious cyanosis  MS: moves all visible extremities without noticeable abnormality  PSYCH/NEURO: pleasant and cooperative, no obvious depression or anxiety, speech and thought processing grossly intact  ASSESSMENT AND PLAN:  Discussed the following assessment and plan:  Cervical radiculopathy We discussed referral to neurosurgery versus going ahead and ordering an ESI.  He would like to have him neurosurgery consultation initially.  Referral placed.     I discussed the assessment and treatment plan with the patient. The patient was  provided an opportunity to ask questions and all were answered. The patient agreed with the plan and demonstrated an understanding of the instructions.   The patient was advised to call back or seek an in-person evaluation if the symptoms worsen or if the condition fails to improve as anticipated.    , DO

## 2022-08-30 ENCOUNTER — Ambulatory Visit: Payer: Medicare Other | Attending: Family Medicine | Admitting: Rehabilitative and Restorative Service Providers"

## 2022-08-30 ENCOUNTER — Encounter: Payer: Self-pay | Admitting: Rehabilitative and Restorative Service Providers"

## 2022-08-30 DIAGNOSIS — M542 Cervicalgia: Secondary | ICD-10-CM | POA: Diagnosis present

## 2022-08-30 DIAGNOSIS — M25551 Pain in right hip: Secondary | ICD-10-CM | POA: Insufficient documentation

## 2022-08-30 DIAGNOSIS — M6281 Muscle weakness (generalized): Secondary | ICD-10-CM | POA: Diagnosis present

## 2022-08-30 DIAGNOSIS — R293 Abnormal posture: Secondary | ICD-10-CM | POA: Insufficient documentation
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
## 2022-09-04 ENCOUNTER — Other Ambulatory Visit: Payer: Self-pay | Admitting: Family Medicine

## 2022-09-04 ENCOUNTER — Other Ambulatory Visit: Payer: Self-pay

## 2022-09-04 DIAGNOSIS — E119 Type 2 diabetes mellitus without complications: Secondary | ICD-10-CM

## 2022-09-04 MED ORDER — RYBELSUS 7 MG PO TABS: 7.0000 mg | ORAL_TABLET | Freq: Every day | ORAL | 0 refills | Status: DC

## 2022-09-05 LAB — COLOGUARD: COLOGUARD: NEGATIVE

## 2022-09-06 ENCOUNTER — Telehealth: Payer: Self-pay

## 2022-09-06 ENCOUNTER — Other Ambulatory Visit: Payer: Self-pay | Admitting: Family Medicine

## 2022-09-06 NOTE — Telephone Encounter (Signed)
Pt called to state that the rybelsus was going to be over $700 and he could not afford that. Please send in an alternative medication.

## 2022-09-06 NOTE — Telephone Encounter (Signed)
Spoke with patient, he reports that the xigduo is also expensive at $145 per 30 days. He has been cutting it back to once daily to make it last longer. He is asking if he can just be put on metformin and work on his diet?

## 2022-09-06 NOTE — Telephone Encounter (Signed)
It appears ozempic and rybelsus are the two preferred by his plan.  He reports that both are too expensive.  No really an alternative to this.  Encourage continued use of xigduo and working on dietary changes to improve glucose until next visit.   CM

## 2022-09-07 MED ORDER — METFORMIN HCL ER 500 MG PO TB24: 1000.0000 mg | ORAL_TABLET | Freq: Two times a day (BID) | ORAL | 1 refills | Status: DC

## 2022-09-07 NOTE — Telephone Encounter (Signed)
Spoke with patient, he is aware of the to finish the xigduo and then start the metformin so he does not waste any meds. Advised patient to lay off carbs and sweets and to get regular exercise.  He verbalized understanding.

## 2022-09-07 NOTE — Telephone Encounter (Signed)
Pt called back to report that glipizide ER 10mg  is covered under his plan at no cost. Would this be beneficial to add to the metformin?

## 2022-09-07 NOTE — Telephone Encounter (Signed)
We can do that but he is really going to have to work on changes to be successful.  Metformin sent in.   CM

## 2022-09-10 NOTE — Telephone Encounter (Signed)
This may help with blood sugar control but typically causes weight gain.  Let's try the metformin and have him work on diet initially.   CM

## 2022-09-11 NOTE — Telephone Encounter (Signed)
LVM with recommendations and callback information for questions.

## 2022-10-02 ENCOUNTER — Other Ambulatory Visit: Payer: Self-pay | Admitting: Family Medicine

## 2022-10-02 DIAGNOSIS — K219 Gastro-esophageal reflux disease without esophagitis: Secondary | ICD-10-CM

## 2022-11-01 DIAGNOSIS — Z6833 Body mass index (BMI) 33.0-33.9, adult: Secondary | ICD-10-CM | POA: Diagnosis not present

## 2022-11-01 DIAGNOSIS — M5412 Radiculopathy, cervical region: Secondary | ICD-10-CM | POA: Diagnosis not present

## 2022-11-02 ENCOUNTER — Other Ambulatory Visit: Payer: Self-pay | Admitting: Student

## 2022-11-02 DIAGNOSIS — M5010 Cervical disc disorder with radiculopathy, unspecified cervical region: Secondary | ICD-10-CM

## 2022-11-05 ENCOUNTER — Ambulatory Visit (INDEPENDENT_AMBULATORY_CARE_PROVIDER_SITE_OTHER): Payer: Medicare HMO

## 2022-11-05 DIAGNOSIS — M5010 Cervical disc disorder with radiculopathy, unspecified cervical region: Secondary | ICD-10-CM | POA: Diagnosis not present

## 2022-11-05 DIAGNOSIS — M542 Cervicalgia: Secondary | ICD-10-CM | POA: Diagnosis not present

## 2022-11-09 ENCOUNTER — Other Ambulatory Visit: Payer: Self-pay | Admitting: Family Medicine

## 2022-11-09 DIAGNOSIS — I10 Essential (primary) hypertension: Secondary | ICD-10-CM

## 2022-11-13 DIAGNOSIS — M5412 Radiculopathy, cervical region: Secondary | ICD-10-CM | POA: Diagnosis not present

## 2022-11-13 DIAGNOSIS — Z6833 Body mass index (BMI) 33.0-33.9, adult: Secondary | ICD-10-CM | POA: Diagnosis not present

## 2022-11-30 ENCOUNTER — Other Ambulatory Visit: Payer: Self-pay | Admitting: Family Medicine

## 2022-11-30 DIAGNOSIS — E1169 Type 2 diabetes mellitus with other specified complication: Secondary | ICD-10-CM

## 2022-11-30 DIAGNOSIS — I10 Essential (primary) hypertension: Secondary | ICD-10-CM

## 2022-12-05 ENCOUNTER — Ambulatory Visit (INDEPENDENT_AMBULATORY_CARE_PROVIDER_SITE_OTHER): Payer: Medicare HMO | Admitting: Family Medicine

## 2022-12-05 ENCOUNTER — Encounter: Payer: Self-pay | Admitting: Family Medicine

## 2022-12-05 VITALS — BP 135/74 | HR 73 | Ht 70.0 in | Wt 230.0 lb

## 2022-12-05 DIAGNOSIS — R399 Unspecified symptoms and signs involving the genitourinary system: Secondary | ICD-10-CM | POA: Diagnosis not present

## 2022-12-05 DIAGNOSIS — M5412 Radiculopathy, cervical region: Secondary | ICD-10-CM | POA: Diagnosis not present

## 2022-12-05 DIAGNOSIS — I10 Essential (primary) hypertension: Secondary | ICD-10-CM | POA: Diagnosis not present

## 2022-12-05 DIAGNOSIS — E039 Hypothyroidism, unspecified: Secondary | ICD-10-CM | POA: Diagnosis not present

## 2022-12-05 DIAGNOSIS — E119 Type 2 diabetes mellitus without complications: Secondary | ICD-10-CM

## 2022-12-05 DIAGNOSIS — E785 Hyperlipidemia, unspecified: Secondary | ICD-10-CM | POA: Diagnosis not present

## 2022-12-05 DIAGNOSIS — R21 Rash and other nonspecific skin eruption: Secondary | ICD-10-CM

## 2022-12-05 DIAGNOSIS — M5441 Lumbago with sciatica, right side: Secondary | ICD-10-CM

## 2022-12-05 DIAGNOSIS — Z794 Long term (current) use of insulin: Secondary | ICD-10-CM | POA: Diagnosis not present

## 2022-12-05 DIAGNOSIS — Z125 Encounter for screening for malignant neoplasm of prostate: Secondary | ICD-10-CM | POA: Diagnosis not present

## 2022-12-05 DIAGNOSIS — E1169 Type 2 diabetes mellitus with other specified complication: Secondary | ICD-10-CM

## 2022-12-05 LAB — POCT GLYCOSYLATED HEMOGLOBIN (HGB A1C): HbA1c, POC (controlled diabetic range): 9.6 % — AB (ref 0.0–7.0)

## 2022-12-05 MED ORDER — DIAZEPAM 5 MG PO TABS: 5.0000 mg | ORAL_TABLET | Freq: Three times a day (TID) | ORAL | 0 refills | Status: DC | PRN

## 2022-12-05 MED ORDER — NAPROXEN 500 MG PO TABS: 500.0000 mg | ORAL_TABLET | Freq: Two times a day (BID) | ORAL | 0 refills | Status: DC

## 2022-12-05 MED ORDER — RYBELSUS 7 MG PO TABS: 7.0000 mg | ORAL_TABLET | Freq: Every day | ORAL | 1 refills | Status: DC

## 2022-12-05 MED ORDER — METRONIDAZOLE 0.75 % EX LOTN: TOPICAL_LOTION | CUTANEOUS | 0 refills | Status: DC

## 2022-12-05 MED ORDER — GABAPENTIN 300 MG PO CAPS: 300.0000 mg | ORAL_CAPSULE | Freq: Every day | ORAL | 3 refills | Status: DC

## 2022-12-05 NOTE — Progress Notes (Signed)
Oscar Gallagher - 67 y.o. male MRN [id number]  Date of birth: 04/21/1956  Subjective Chief Complaint  Patient presents with   Hypertension   Diabetes    HPI Oscar Gallagher is a 67 y.o. male here today for follow up visit.    He reports that he is doing [place].  Continues to have neck and back pain.  Seen by neurosurgery and contemplating having surgery on his neck.  He is also having some sciatica in his R leg.   Currnetly taking xigduo for management of diabetes.  Prescribed rybelsus as well but has not been taking this due to cost.  Insurance did recently change so he may be able to get this now. Not monitoring glucose at home.  A1c has increased to 9.6%  HTN is managed with combiantion of maxzide and ramipril.  Doing well with this.  Denies side effects from medication.  He has not had chest pain, shortness of breath, palpitations, headache or vision changes.   Tolerating crestor and fenofibrate as well at current strength.  Due for updated labs.   Remains on flomax for management of LUT's.   Has some red spots on the face.  Some irritation when shaving.    ROS:  A comprehensive ROS was completed and negative except as noted per HPI  Allergies  Allergen Reactions   Aspirin Other (See Comments)    Reports a history of Peptic Ulcer Disease    Past Medical History:  Diagnosis Date   Diabetes mellitus without complication ([place])    Hyperlipidemia    Hypertension    Stroke [facility])     Past Surgical History:  Procedure Laterality Date   CARPAL TUNNEL RELEASE Right 02/10/2019   ELBOW FRACTURE SURGERY Left    Prosthesis on radial bone   HERNIA REPAIR      Social History   Socioeconomic History   Marital status: Married    Spouse name: Not on file   Number of children: Not on file   Years of education: Not on file   Highest education level: Not on file  Occupational History   Not on file  Tobacco Use   Smoking status: Never   Smokeless tobacco: Never  Substance and  Sexual Activity   Alcohol use: No   Drug use: No   Sexual activity: Not on file  Other Topics Concern   Not on file  Social History Narrative   Not on file   Social Determinants of Health   Financial Resource Strain: Not on file  Food Insecurity: Not on file  Transportation Needs: Not on file  Physical Activity: Not on file  Stress: Not on file  Social Connections: Not on file    Family History  Problem Relation Age of Onset   Cancer Father        lung CA    Health Maintenance  Topic Date Due   Diabetic kidney evaluation - Urine ACR  Never done   OPHTHALMOLOGY EXAM  06/07/2023 (Originally [birth year]/14/1966)   Zoster Vaccines- Shingrix (1 of 2) 06/07/2023 (Originally 02/14/2006)   HEMOGLOBIN A1C  06/07/2023   Medicare Annual Wellness (AWV)  07/19/2023   Diabetic kidney evaluation - eGFR measurement  08/01/2023   FOOT EXAM  12/05/2023   Fecal DNA (Cologuard)  08/24/2025   DTaP/Tdap/Td (3 - Td or Tdap) 07/06/2026   Pneumonia Vaccine 63+ Years old  Completed   INFLUENZA VACCINE  Completed   COVID-19 Vaccine  Completed   Hepatitis C Screening  Completed  HPV VACCINES  Aged Out     ----------------------------------------------------------------------------------------------------------------------------------------------------------------------------------------------------------------- Physical Exam BP 135/74 (BP Location: Left Arm, Patient Position: Sitting, Cuff Size: Large)   Pulse 73   Ht '5\' 10"'$  (1.778 m)   Wt 230 lb (104.3 kg)   SpO2 98%   BMI 33.00 kg/m   Physical Exam Constitutional:      Appearance: Normal appearance.  HENT:     Head: Normocephalic and atraumatic.  Eyes:     General: No scleral icterus. Cardiovascular:     Rate and Rhythm: Normal rate and regular rhythm.  Pulmonary:     Effort: Pulmonary effort is normal.     Breath sounds: Normal breath sounds.  Musculoskeletal:     Cervical back: Neck supple.  Neurological:     General: No focal  deficit present.     Mental Status: He is alert.  Psychiatric:        Mood and Affect: Mood normal.        Behavior: Behavior normal.     ------------------------------------------------------------------------------------------------------------------------------------------------------------------------------------------------------------------- Assessment and Plan  Essential hypertension BP is pretty well controlled. Continue current medications for management of HTN.   Acquired hypothyroidism Update TSH  Type 2 diabetes mellitus without complication, with long-term current use of insulin (HCC) Control of diabetes has worsened. Adding Rybelsus back on.  Continue xigduo.   Cervical radiculopathy Seeing neurosurgery, contemplating having surgery done for this.  Will add gabapentin at night as he is also having some sciatica pain.   Lower urinary tract symptoms (LUTS) Update PSA.    Rash Some erythematous papules on face. Will add metronidazole lotion for possible rosacea.     Meds ordered this encounter  Medications   gabapentin (NEURONTIN) 300 MG capsule    Sig: Take 1-2 capsules (300-600 mg total) by mouth at bedtime.    Dispense:  90 capsule    Refill:  3   Semaglutide (RYBELSUS) 7 MG TABS    Sig: Take 1 tablet (7 mg total) by mouth daily.    Dispense:  90 tablet    Refill:  1   diazepam (VALIUM) 5 MG tablet    Sig: Take 1 tablet (5 mg total) by mouth every 8 (eight) hours as needed for up to 10 doses for muscle spasms.    Dispense:  10 tablet    Refill:  0   naproxen (NAPROSYN) 500 MG tablet    Sig: Take 1 tablet (500 mg total) by mouth 2 (two) times daily.    Dispense:  30 tablet    Refill:  0   METRONIDAZOLE, TOPICAL, 0.75 % LOTN    Sig: Apply topically bid.    Dispense:  59 mL    Refill:  0    Return in about 3 months (around 03/07/2023) for T2DM.    This visit occurred during the SARS-CoV-2 public health emergency.  Safety protocols were in place,  including screening questions prior to the visit, additional usage of staff PPE, and extensive cleaning of exam room while observing appropriate contact time as indicated for disinfecting solutions.

## 2022-12-05 NOTE — Assessment & Plan Note (Signed)
Some erythematous papules on face. Will add metronidazole lotion for possible rosacea.

## 2022-12-05 NOTE — Assessment & Plan Note (Signed)
Control of diabetes has worsened. Adding Rybelsus back on.  Continue xigduo.

## 2022-12-05 NOTE — Assessment & Plan Note (Signed)
BP is pretty well controlled. Continue current medications for management of HTN.

## 2022-12-05 NOTE — Assessment & Plan Note (Signed)
Seeing neurosurgery, contemplating having surgery done for this.  Will add gabapentin at night as he is also having some sciatica pain.

## 2022-12-05 NOTE — Assessment & Plan Note (Signed)
Update TSH

## 2022-12-05 NOTE — Assessment & Plan Note (Signed)
Update PSA 

## 2022-12-06 LAB — MICROALBUMIN / CREATININE URINE RATIO
Creatinine, Urine: 82 mg/dL (ref 20–320)
Microalb Creat Ratio: 4 mcg/mg creat (ref <30)
Microalb, Ur: 0.3 mg/dL

## 2022-12-06 LAB — LIPID PANEL W/REFLEX DIRECT LDL
Cholesterol: 152 mg/dL (ref <200)
HDL: 36 mg/dL — ABNORMAL LOW (ref >=40)
LDL Cholesterol (Calc): 75 mg/dL (calc)
Non-HDL Cholesterol (Calc): 116 mg/dL (calc) (ref <130)
Total CHOL/HDL Ratio: 4.2 (calc) (ref <5.0)
Triglycerides: 343 mg/dL — ABNORMAL HIGH (ref <150)

## 2022-12-06 LAB — COMPLETE METABOLIC PANEL WITH GFR
AG Ratio: 2.1 (calc) (ref 1.0–2.5)
ALT: 23 U/L (ref 9–46)
AST: 30 U/L (ref 10–35)
Albumin: 4.8 g/dL (ref 3.6–5.1)
Alkaline phosphatase (APISO): 39 U/L (ref 35–144)
BUN: 16 mg/dL (ref 7–25)
CO2: 29 mmol/L (ref 20–32)
Calcium: 10.1 mg/dL (ref 8.6–10.3)
Chloride: 102 mmol/L (ref 98–110)
Creat: 1.23 mg/dL (ref 0.70–1.35)
Globulin: 2.3 g/dL (calc) (ref 1.9–3.7)
Glucose, Bld: 181 mg/dL — ABNORMAL HIGH (ref 65–99)
Potassium: 4.8 mmol/L (ref 3.5–5.3)
Sodium: 141 mmol/L (ref 135–146)
Total Bilirubin: 0.7 mg/dL (ref 0.2–1.2)
Total Protein: 7.1 g/dL (ref 6.1–8.1)
eGFR: 65 mL/min/{1.73_m2} (ref >=60)

## 2022-12-06 LAB — CBC WITH DIFFERENTIAL/PLATELET
Absolute Monocytes: 320 cells/uL (ref 200–950)
Basophils Absolute: 31 cells/uL (ref 0–200)
Basophils Relative: 0.8 %
Eosinophils Absolute: 121 cells/uL (ref 15–500)
Eosinophils Relative: 3.1 %
HCT: 42.8 % (ref 38.5–50.0)
Hemoglobin: 14.2 g/dL (ref 13.2–17.1)
Lymphs Abs: 1225 cells/uL (ref 850–3900)
MCH: 29.8 pg (ref 27.0–33.0)
MCHC: 33.2 g/dL (ref 32.0–36.0)
MCV: 89.9 fL (ref 80.0–100.0)
MPV: 12.2 fL (ref 7.5–12.5)
Monocytes Relative: 8.2 %
Neutro Abs: 2204 cells/uL (ref 1500–7800)
Neutrophils Relative %: 56.5 %
Platelets: 152 10*3/uL (ref 140–400)
RBC: 4.76 10*6/uL (ref 4.20–5.80)
RDW: 13.7 % (ref 11.0–15.0)
Total Lymphocyte: 31.4 %
WBC: 3.9 10*3/uL (ref 3.8–10.8)

## 2022-12-06 LAB — PSA: PSA: 0.41 ng/mL (ref <=4.00)

## 2022-12-06 LAB — TSH: TSH: 0.25 mIU/L — ABNORMAL LOW (ref 0.40–4.50)

## 2022-12-10 ENCOUNTER — Other Ambulatory Visit: Payer: Self-pay | Admitting: Family Medicine

## 2022-12-10 DIAGNOSIS — E039 Hypothyroidism, unspecified: Secondary | ICD-10-CM

## 2022-12-10 MED ORDER — SYNTHROID 88 MCG PO TABS: 88.0000 ug | ORAL_TABLET | Freq: Every day | ORAL | 1 refills | Status: DC

## 2023-01-09 ENCOUNTER — Other Ambulatory Visit: Payer: Self-pay | Admitting: Family Medicine

## 2023-01-14 ENCOUNTER — Other Ambulatory Visit: Payer: Self-pay | Admitting: Family Medicine

## 2023-01-16 MED ORDER — DIAZEPAM 5 MG PO TABS: 5.0000 mg | ORAL_TABLET | Freq: Three times a day (TID) | ORAL | 0 refills | Status: DC | PRN

## 2023-01-16 MED ORDER — NAPROXEN 500 MG PO TABS: 500.0000 mg | ORAL_TABLET | Freq: Two times a day (BID) | ORAL | 0 refills | Status: DC

## 2023-01-21 ENCOUNTER — Other Ambulatory Visit: Payer: Self-pay | Admitting: Family Medicine

## 2023-01-21 DIAGNOSIS — E119 Type 2 diabetes mellitus without complications: Secondary | ICD-10-CM

## 2023-01-28 ENCOUNTER — Other Ambulatory Visit: Payer: Self-pay | Admitting: Family Medicine

## 2023-01-30 ENCOUNTER — Other Ambulatory Visit: Payer: Self-pay | Admitting: Family Medicine

## 2023-01-30 DIAGNOSIS — E119 Type 2 diabetes mellitus without complications: Secondary | ICD-10-CM

## 2023-01-30 NOTE — Telephone Encounter (Signed)
Patient called requesting a refill of XIGDUO XR 01-999 MG TB24 [[id number]]   Patient stated he has been taking two a day not one a day. Patient has run out of the medication and is unable to get a refill until June. Pharmacy said they would reach out to [place].  [phone] [place] [place] - [address]  [address] [place], [place] [address]

## 2023-01-31 MED ORDER — XIGDUO XR 5-1000 MG PO TB24
1.0000 | ORAL_TABLET | Freq: Two times a day (BID) | ORAL | 0 refills | Status: DC
Start: 2023-01-31 — End: 2023-04-01

## 2023-02-01 ENCOUNTER — Encounter: Payer: Self-pay | Admitting: Family Medicine

## 2023-02-12 ENCOUNTER — Other Ambulatory Visit: Payer: Self-pay | Admitting: Family Medicine

## 2023-02-12 DIAGNOSIS — Z6832 Body mass index (BMI) 32.0-32.9, adult: Secondary | ICD-10-CM | POA: Diagnosis not present

## 2023-02-12 DIAGNOSIS — M25511 Pain in right shoulder: Secondary | ICD-10-CM | POA: Diagnosis not present

## 2023-02-22 ENCOUNTER — Other Ambulatory Visit: Payer: Self-pay | Admitting: Family Medicine

## 2023-02-22 DIAGNOSIS — M25511 Pain in right shoulder: Secondary | ICD-10-CM | POA: Diagnosis not present

## 2023-02-22 DIAGNOSIS — Z6832 Body mass index (BMI) 32.0-32.9, adult: Secondary | ICD-10-CM | POA: Diagnosis not present

## 2023-02-22 MED ORDER — TADALAFIL 5 MG PO TABS: 5.0000 mg | ORAL_TABLET | Freq: Every day | ORAL | 3 refills | Status: DC

## 2023-03-07 ENCOUNTER — Encounter: Payer: Self-pay | Admitting: Family Medicine

## 2023-03-07 ENCOUNTER — Ambulatory Visit (INDEPENDENT_AMBULATORY_CARE_PROVIDER_SITE_OTHER): Payer: Medicare HMO | Admitting: Family Medicine

## 2023-03-07 VITALS — BP 138/78 | HR 69 | Ht 70.0 in | Wt 223.0 lb

## 2023-03-07 DIAGNOSIS — Z794 Long term (current) use of insulin: Secondary | ICD-10-CM | POA: Diagnosis not present

## 2023-03-07 DIAGNOSIS — E039 Hypothyroidism, unspecified: Secondary | ICD-10-CM | POA: Diagnosis not present

## 2023-03-07 DIAGNOSIS — I10 Essential (primary) hypertension: Secondary | ICD-10-CM

## 2023-03-07 DIAGNOSIS — G8929 Other chronic pain: Secondary | ICD-10-CM

## 2023-03-07 DIAGNOSIS — M75101 Unspecified rotator cuff tear or rupture of right shoulder, not specified as traumatic: Secondary | ICD-10-CM | POA: Insufficient documentation

## 2023-03-07 DIAGNOSIS — E119 Type 2 diabetes mellitus without complications: Secondary | ICD-10-CM | POA: Diagnosis not present

## 2023-03-07 DIAGNOSIS — M25511 Pain in right shoulder: Secondary | ICD-10-CM | POA: Diagnosis not present

## 2023-03-07 DIAGNOSIS — M5441 Lumbago with sciatica, right side: Secondary | ICD-10-CM | POA: Diagnosis not present

## 2023-03-07 LAB — POCT GLYCOSYLATED HEMOGLOBIN (HGB A1C): HbA1c, POC (controlled diabetic range): 7.4 % — AB (ref 0.0–7.0)

## 2023-03-07 MED ORDER — TRIAZOLAM 0.25 MG PO TABS: ORAL_TABLET | ORAL | 0 refills | Status: DC

## 2023-03-07 MED ORDER — TRAMADOL HCL 50 MG PO TABS: 50.0000 mg | ORAL_TABLET | Freq: Three times a day (TID) | ORAL | 0 refills | Status: DC | PRN

## 2023-03-07 NOTE — Assessment & Plan Note (Addendum)
He is having some issues with prices of medication for management of his diabetes.  Referral placed to clinical pharmacist to see if he may qualify for patient assistance for Rybelsus or Xigduo.  I would like to keep him on the use as he is doing quite well and blood sugars have improved quite a bit.

## 2023-03-07 NOTE — Progress Notes (Signed)
Asbury Hair - 67 y.o. male MRN [id number]  Date of birth: 11/16/1955  Subjective Chief Complaint  Patient presents with   Diabetes   Hypertension    HPI Oscar Gallagher is a 67 year old male here today for follow-up visit.  He continues on combination of Xigduo and Rybelsus for management of his diabetes.  He reports that these medications do cost him quite a bit each month.  He has not really looked into patient assistance in the past.  He is doing quite well with these and A1c is down to 7.4%, previously at 9.6%.  He has made some changes to his diet as well.  Blood pressure mains pretty well-controlled with ramipril and triamterene/hydrochlorothiazide.  Tolerating well.  Denies chest pain, shortness of breath, palpitations, headaches or vision changes.  He continues to have low back pain with sciatica on the right side.  He did have CT of the lumbar spine and November showing degenerative disc disease at L4-L5 and L5-S1.  He did physical therapy in November and December and he has continued on a home exercise plan over the past several months.  Has not had significant improvement.  He has not had MRI.  He has been seeing neurosurgery for cervical radiculitis.  He had some rotator cuff weakness on previous exam with neurosurgery with positive empty can.  He did receive an injection in the right shoulder which helped slightly but continues to have some pain and weakness.  ROS:  A comprehensive ROS was completed and negative except as noted per HPI   Past Medical History:  Diagnosis Date   Diabetes mellitus without complication (HCC)    Hyperlipidemia    Hypertension    Stroke [facility])     Past Surgical History:  Procedure Laterality Date   CARPAL TUNNEL RELEASE Right 02/10/2019   ELBOW FRACTURE SURGERY Left    Prosthesis on radial bone   HERNIA REPAIR      Social History   Socioeconomic History   Marital status: Married    Spouse name: Not on file   Number of children: Not  on file   Years of education: Not on file   Highest education level: 12th grade  Occupational History   Not on file  Tobacco Use   Smoking status: Never   Smokeless tobacco: Never  Substance and Sexual Activity   Alcohol use: No   Drug use: No   Sexual activity: Not on file  Other Topics Concern   Not on file  Social History Narrative   Not on file   Social Determinants of Health   Financial Resource Strain: Patient Declined (03/03/2023)   Overall Financial Resource Strain (CARDIA)    Difficulty of Paying Living Expenses: Patient declined  Food Insecurity: No Food Insecurity (03/03/2023)   Hunger Vital Sign    Worried About Running Out of Food in the Last Year: Never true    Ran Out of Food in the Last Year: Never true  Transportation Needs: No Transportation Needs (03/03/2023)   PRAPARE - Administrator, Civil Service (Medical): No    Lack of Transportation (Non-Medical): No  Physical Activity: Unknown (03/03/2023)   Exercise Vital Sign    Days of Exercise per Week: Patient declined    Minutes of Exercise per Session: Not on file  Stress: No Stress Concern Present (03/03/2023)   [employer] of Occupational Health - Occupational Stress Questionnaire    Feeling of Stress : Not at all  Social Connections: Moderately Integrated (03/03/2023)  Social Connection and Isolation Panel [NHANES]    Frequency of Communication with Friends and Family: More than three times a week    Frequency of Social Gatherings with Friends and Family: Once a week    Attends Religious Services: More than 4 times per year    Active Member of Clubs or Organizations: No    Attends Engineer, structural: Not on file    Marital Status: Married    Family History  Problem Relation Age of Onset   Cancer Father        lung CA    Health Maintenance  Topic Date Due   Zoster Vaccines- Shingrix (1 of 2) 06/07/2023 (Originally 02/14/2006)   INFLUENZA VACCINE  04/25/2023   Medicare Annual  Wellness (AWV)  07/19/2023   OPHTHALMOLOGY EXAM  08/10/2023   HEMOGLOBIN A1C  09/06/2023   Diabetic kidney evaluation - eGFR measurement  12/05/2023   Diabetic kidney evaluation - Urine ACR  12/05/2023   FOOT EXAM  12/05/2023   Fecal DNA (Cologuard)  08/24/2025   DTaP/Tdap/Td (3 - Td or Tdap) 07/06/2026   Pneumonia Vaccine 35+ Years old  Completed   COVID-19 Vaccine  Completed   Hepatitis C Screening  Completed   HPV VACCINES  Aged Out     ----------------------------------------------------------------------------------------------------------------------------------------------------------------------------------------------------------------- Physical Exam BP 138/78 (BP Location: Left Arm, Patient Position: Sitting, Cuff Size: Normal)   Pulse 69   Ht 5\' 10"  (1.778 m)   Wt 223 lb (101.2 kg)   SpO2 99%   BMI 32.00 kg/m   Physical Exam Constitutional:      Appearance: Normal appearance.  HENT:     Head: Normocephalic and atraumatic.  Eyes:     General: No scleral icterus. Cardiovascular:     Rate and Rhythm: Normal rate and regular rhythm.  Pulmonary:     Effort: Pulmonary effort is normal.     Breath sounds: Normal breath sounds.  Musculoskeletal:     Comments: Right shoulder: Weakness with abduction.  Range of motion is limited in abduction.  Negative impingement signs.  Positive empty can  Neurological:     Mental Status: He is alert.  Psychiatric:        Mood and Affect: Mood normal.        Behavior: Behavior normal.     ------------------------------------------------------------------------------------------------------------------------------------------------------------------------------------------------------------------- Assessment and Plan  Essential hypertension Blood pressure mains fairly well-controlled.  Continue current medications for management of hypertension.  Acquired hypothyroidism Doing well with current strength of levothyroxine.   Continue current strength.  Type 2 diabetes mellitus without complication, with long-term current use of insulin (HCC) He is having some issues with prices of medication for management of his diabetes.  Referral placed to clinical pharmacist to see if he may qualify for patient assistance for Rybelsus or Xigduo.  I would like to keep him on the use as he is doing quite well and blood sugars have improved quite a bit.  Acute right-sided low back pain with right-sided sciatica He has had continued sciatica.  Tried physical therapy as well as home exercise plan with continued symptoms.  Order for MRI lumbar spine entered.  Right shoulder pain Continues to have right shoulder pain with rotator cuff weakness.  Orders entered for MRI arthrogram of the right shoulder.     Meds ordered this encounter  Medications   triazolam (HALCION) 0.25 MG tablet    Sig: Take 1 hour prior to MRI    Dispense:  1 tablet    Refill:  0  DISCONTD: traMADol (ULTRAM) 50 MG tablet    Sig: Take 1-2 tablets (50-100 mg total) by mouth every 8 (eight) hours as needed for up to 5 days for severe pain.    Dispense:  30 tablet    Refill:  0    No follow-ups on file.    This visit occurred during the SARS-CoV-2 public health emergency.  Safety protocols were in place, including screening questions prior to the visit, additional usage of staff PPE, and extensive cleaning of exam room while observing appropriate contact time as indicated for disinfecting solutions.

## 2023-03-07 NOTE — Patient Instructions (Signed)
Let's continue with current medication.  See me again in 3-4 months.

## 2023-03-07 NOTE — Assessment & Plan Note (Signed)
Continues to have right shoulder pain with rotator cuff weakness.  Orders entered for MRI arthrogram of the right shoulder.

## 2023-03-07 NOTE — Assessment & Plan Note (Signed)
He has had continued sciatica.  Tried physical therapy as well as home exercise plan with continued symptoms.  Order for MRI lumbar spine entered.

## 2023-03-07 NOTE — Assessment & Plan Note (Signed)
Blood pressure mains fairly well-controlled.  Continue current medications for management of hypertension.

## 2023-03-07 NOTE — Assessment & Plan Note (Signed)
Doing well with current strength of levothyroxine.  Continue current strength.

## 2023-03-13 ENCOUNTER — Telehealth: Payer: Self-pay

## 2023-03-13 NOTE — Progress Notes (Signed)
   Care Guide Note  03/13/2023 Name: Oscar Gallagher MRN: [id number] DOB: 1955-09-26  Referred by: Everrett Coombe, DO Reason for referral : Care Coordination (Outreach to schedule with Pharm d )   Oscar Gallagher is a 67 y.o. year old male who is a primary care patient of Everrett Coombe, DO. Oscar Gallagher was referred to the pharmacist for assistance related to DM.    Successful contact was made with the patient to discuss pharmacy services including being ready for the pharmacist to call at least 5 minutes before the scheduled appointment time, to have medication bottles and any blood sugar or blood pressure readings ready for review. The patient agreed to meet with the pharmacist via with the pharmacist via telephone visit on (date/time).  03/27/2023  Penne Lash, RMA Care Guide [facility]  [place], [place] [address] Direct Dial: [phone] [email or web address]

## 2023-03-17 ENCOUNTER — Ambulatory Visit (INDEPENDENT_AMBULATORY_CARE_PROVIDER_SITE_OTHER): Payer: Medicare HMO

## 2023-03-17 DIAGNOSIS — M47816 Spondylosis without myelopathy or radiculopathy, lumbar region: Secondary | ICD-10-CM | POA: Diagnosis not present

## 2023-03-17 DIAGNOSIS — G8929 Other chronic pain: Secondary | ICD-10-CM

## 2023-03-17 DIAGNOSIS — M545 Low back pain, unspecified: Secondary | ICD-10-CM | POA: Diagnosis not present

## 2023-03-17 DIAGNOSIS — M4317 Spondylolisthesis, lumbosacral region: Secondary | ICD-10-CM | POA: Diagnosis not present

## 2023-03-17 DIAGNOSIS — M5441 Lumbago with sciatica, right side: Secondary | ICD-10-CM

## 2023-03-17 DIAGNOSIS — M129 Arthropathy, unspecified: Secondary | ICD-10-CM | POA: Diagnosis not present

## 2023-03-17 DIAGNOSIS — M75101 Unspecified rotator cuff tear or rupture of right shoulder, not specified as traumatic: Secondary | ICD-10-CM | POA: Diagnosis not present

## 2023-03-17 DIAGNOSIS — M25511 Pain in right shoulder: Secondary | ICD-10-CM

## 2023-03-17 DIAGNOSIS — M7581 Other shoulder lesions, right shoulder: Secondary | ICD-10-CM | POA: Diagnosis not present

## 2023-03-26 ENCOUNTER — Other Ambulatory Visit: Payer: Self-pay | Admitting: Family Medicine

## 2023-03-26 DIAGNOSIS — K219 Gastro-esophageal reflux disease without esophagitis: Secondary | ICD-10-CM

## 2023-03-27 ENCOUNTER — Other Ambulatory Visit: Payer: Medicare HMO

## 2023-03-27 NOTE — Progress Notes (Signed)
03/27/2023 Name: Oscar Gallagher MRN: [id number] DOB: 31-Dec-1955  Chief Complaint  Patient presents with   Medication Assistance   Oscar Gallagher is a 67 y.o. year old male who presented for a telephone visit.   They were referred to the pharmacist by their PCP for assistance in managing medication access.    Subjective:  Care Team: Primary Care Provider: Everrett Coombe, DO ; Next Scheduled Visit: 10/14  Medication Access/Adherence [facility] [place], [place] -Patient reports affordability concerns with their medications: Yes  -Patient reports access/transportation concerns to their pharmacy: No  -Patient reports adherence concerns with their medications:  No    Diabetes: Current medications: Rybelsus 7mg  daily, Xigduo XR 5/1000mg  BID -Current glucose readings: FBG 100-115 -Patient denies hypoglycemic s/sx including dizziness, shakiness, sweating.  -Patient denies hyperglycemic symptoms including polyuria, polydipsia, polyphagia, nocturia, neuropathy, blurred vision. -Current medication access support: None, and patient does express affordability concerns with Rybelsus and Xigduo- both medications are >$200 a piece each month  Objective: Lab Results  Component Value Date   HGBA1C 7.4 (A) 03/07/2023   Medications Reviewed Today     Reviewed by Lenna Gilford, RPH (Pharmacist) on 03/27/23 at 1111  Med List Status: <None>   Medication Order Taking? Sig Documenting Provider Last Dose Status Informant  clotrimazole-betamethasone (LOTRISONE) cream [id number]  Apply 1 application topically 2 (two) times daily.  Patient not taking: Reported on 03/07/2023   Everrett Coombe, DO  Active   famotidine (PEPCID) 40 MG tablet [id number] Yes TAKE 1 TABLET BY MOUTH EVERY DAY Everrett Coombe, DO Taking Active   fenofibrate (TRICOR) 145 MG tablet [id number]  Take 1 tablet (145 mg total) by mouth daily. Everrett Coombe, DO  Expired 04/30/22 2359   glucose blood (FREESTYLE  TEST STRIPS) test strip [id number] Yes Use as instructed Everrett Coombe, DO Taking Active   pantoprazole (PROTONIX) 40 MG tablet [id number] Yes TAKE 1 TABLET BY MOUTH EVERY DAY Everrett Coombe, DO Taking Active   ramipril (ALTACE) 10 MG capsule [id number] Yes TAKE 1 CAPSULE BY MOUTH EVERY DAY Everrett Coombe, DO Taking Active   rosuvastatin (CRESTOR) 10 MG tablet [id number] Yes TAKE 1 TABLET BY MOUTH EVERYDAY AT BEDTIME Everrett Coombe, DO Taking Active   Semaglutide (RYBELSUS) 7 MG TABS [id number] Yes Take 1 tablet (7 mg total) by mouth daily. Everrett Coombe, DO Taking Active   SYNTHROID 88 MCG tablet [id number] Yes Take 1 tablet (88 mcg total) by mouth daily before breakfast. Everrett Coombe, DO Taking Active   tadalafil (CIALIS) 5 MG tablet [id number] Yes Take 1 tablet (5 mg total) by mouth daily. Everrett Coombe, DO Taking Active   triamterene-hydrochlorothiazide [facility]) 37.5-25 MG tablet [id number] Yes TAKE 1 TABLET BY MOUTH EVERY DAY Everrett Coombe, DO Taking Active   triazolam (HALCION) 0.25 MG tablet [id number]  Take 1 hour prior to MRI Everrett Coombe, DO  Active   XIGDUO XR 01-999 MG TB24 [id number] Yes Take 1 tablet by mouth in the morning and at bedtime. Everrett Coombe, DO Taking Active            Assessment/Plan:   Diabetes: - Currently moderately controlled - Meets financial criteria for Xigduo XR and Rybelsus patient assistance program through [employer] and [employer]. Will collaborate with provider, CPhT, and patient to pursue assistance.  - Patient has approximately 7 days of both medications remaining and states he can afford to purchase 1 more month while we pursue PAP.  Will also see if PCP office has any samples patient can get  in the mean time.  Follow Up Plan: Will monitor progress of PAP and follow-up as needed  Lenna Gilford, PharmD, DPLA

## 2023-04-01 ENCOUNTER — Telehealth: Payer: Self-pay

## 2023-04-01 DIAGNOSIS — E119 Type 2 diabetes mellitus without complications: Secondary | ICD-10-CM

## 2023-04-01 MED ORDER — XIGDUO XR 5-1000 MG PO TB24
1.0000 | ORAL_TABLET | Freq: Two times a day (BID) | ORAL | 3 refills | Status: DC
Start: 2023-04-01 — End: 2023-05-01

## 2023-04-01 NOTE — Telephone Encounter (Signed)
-----   Message from Lenna Gilford, [facility] sent at 03/29/2023  9:23 AM EDT ----- Good morning!  Mr. Crummie would qualify for Xigduo XR 5/1000mg  BID and Rybelsus 7mg  daily PAP; would you all mind to start this process for him?  Thank you!  Lenna Gilford, PharmD, DPLA

## 2023-04-01 NOTE — Telephone Encounter (Signed)
Received notification from [employer] regarding approval for Xigduo XR . Patient assistance APPROVED from 04/01/2023 to 09/24/2023.  Phone: [phone]   PLEASE BE ADVISED  TO HAVE PROVIDER TO SEND A NEW SCRIPT TO [facility]. MEDS WILL BE SENT TO PT HOME 2-5 BUSINESS DAYS  [employer] ID IS [id number]  I WILL BE MAILING THE [employer] APP FOR RYBELSUS DUE COMPANY COULD NOT FIND HEALTH CARE PROVIDER TO MATCH THEY HAVE TO UPDATE IN THEIR SYSTEM.

## 2023-04-01 NOTE — Progress Notes (Signed)
   04/01/2023  Patient ID: Oscar Gallagher, male   DOB: 01-Nov-1955, 67 y.o.   MRN: [id number]  Received notification from Medication Assistance team that [name] has been approved for Xigduo PAP, but a prescription must be sent in to [facility] pharmacy to fulfill the order.  A prescription is pending for Dr. Ashley Royalty to sign.  Once this is done, the medication will be dispensed and should arrive at the patient's home in 10-14 business days.  Medication assistance team was unable to submit an application online for Rybelsus PAP, but the application has been mailed to [name] home for completion.  Sending a MyChart message to inform patient of this information.  Lenna Gilford, PharmD, DPLA

## 2023-04-03 NOTE — Telephone Encounter (Signed)
PAP application for RYBELSUS FROM [employer]  has been mailed to pt home.   I will fax PCP pages once I receive pt pages   PLEASE BE ADVISED

## 2023-04-07 NOTE — Progress Notes (Signed)
   04/07/2023  Patient ID: Oscar Gallagher, male   DOB: [birth year], 67 y.o.   MRN: [id number]  Received MyChart message from patient, because he has not yet received the Rybelsus PAP application in the mail.  I Rene Kocher going to request that the medication assistance team resend the application; but upon review, this was just mailed out 7/10.  Patient should receive this coming week.  I will reach out Wednesday to make sure has received it.  Lenna Gilford, PharmD, DPLA

## 2023-04-09 ENCOUNTER — Ambulatory Visit (INDEPENDENT_AMBULATORY_CARE_PROVIDER_SITE_OTHER): Payer: Medicare HMO | Admitting: Family Medicine

## 2023-04-09 ENCOUNTER — Encounter: Payer: Self-pay | Admitting: Family Medicine

## 2023-04-09 VITALS — BP 122/74 | HR 94 | Ht 70.0 in | Wt 222.0 lb

## 2023-04-09 DIAGNOSIS — M25511 Pain in right shoulder: Secondary | ICD-10-CM | POA: Diagnosis not present

## 2023-04-09 DIAGNOSIS — M7551 Bursitis of right shoulder: Secondary | ICD-10-CM

## 2023-04-09 DIAGNOSIS — M5136 Other intervertebral disc degeneration, lumbar region: Secondary | ICD-10-CM

## 2023-04-09 MED ORDER — TRIAMCINOLONE ACETONIDE 40 MG/ML IJ SUSP
40.0000 mg | Freq: Once | INTRAMUSCULAR | Status: DC
Start: 2023-04-09 — End: 2023-04-09

## 2023-04-09 NOTE — Assessment & Plan Note (Signed)
Subacromial injection given today.  Referral placed to physical therapy.  If refractory to this we will plan to refer back to orthopedics.

## 2023-04-09 NOTE — Assessment & Plan Note (Signed)
He is having low back pain with sciatica on the right side.  Placing referral for consideration of ESI.

## 2023-04-09 NOTE — Progress Notes (Signed)
Oscar Gallagher - 68 y.o. male MRN [id number]  Date of birth: 05-28-1956  Subjective No chief complaint on file.   HPI Oscar Gallagher is a 67 year old male here today to review recent imaging.  He had MRI of his lumbar spine as well as his right shoulder.  He continues to have pain in his back and shoulder.  He did have an injection done by orthopedics and his right shoulder previously which sounds like this was an intra-articular injection.  His MRI did show tendinosis of the supraspinatus tendon with small high-grade insertional interstitial tear and a tiny component extending into the bursal surface along the anterior aspect.  There is also moderate tendinosis of the infraspinatus tendon with a small insertional interstitial tear and severe tendinosis of the intra-articular portion of the long head of the biceps tendon.  The labrum cannot be fully evaluated due to lack of contrast.  His previous injection only provided a couple weeks of relief for him.  His MRI of his lumbar spine did show degenerative changes which were stable from previous imaging.  He did have some moderate spinal stenosis at L4-L5 as well as moderate right greater than left foraminal narrowing at L5-S1 on the right.  He is interested in trying lumbar spine injections.  ROS:  A comprehensive ROS was completed and negative except as noted per HPI     Past Medical History:  Diagnosis Date   Diabetes mellitus without complication (HCC)    Hyperlipidemia    Hypertension    Stroke [facility])     Past Surgical History:  Procedure Laterality Date   CARPAL TUNNEL RELEASE Right 02/10/2019   ELBOW FRACTURE SURGERY Left    Prosthesis on radial bone   HERNIA REPAIR      Social History   Socioeconomic History   Marital status: Married    Spouse name: Not on file   Number of children: Not on file   Years of education: Not on file   Highest education level: 12th grade  Occupational History   Not on file  Tobacco Use   Smoking  status: Never   Smokeless tobacco: Never  Substance and Sexual Activity   Alcohol use: No   Drug use: No   Sexual activity: Not on file  Other Topics Concern   Not on file  Social History Narrative   Not on file   Social Determinants of Health   Financial Resource Strain: Patient Declined (03/03/2023)   Overall Financial Resource Strain (CARDIA)    Difficulty of Paying Living Expenses: Patient declined  Food Insecurity: No Food Insecurity (03/03/2023)   Hunger Vital Sign    Worried About Running Out of Food in the Last Year: Never true    Ran Out of Food in the Last Year: Never true  Transportation Needs: No Transportation Needs (03/03/2023)   PRAPARE - Administrator, Civil Service (Medical): No    Lack of Transportation (Non-Medical): No  Physical Activity: Unknown (03/03/2023)   Exercise Vital Sign    Days of Exercise per Week: Patient declined    Minutes of Exercise per Session: Not on file  Stress: No Stress Concern Present (03/03/2023)   [employer] of Occupational Health - Occupational Stress Questionnaire    Feeling of Stress : Not at all  Social Connections: Moderately Integrated (03/03/2023)   Social Connection and Isolation Panel [NHANES]    Frequency of Communication with Friends and Family: More than three times a week    Frequency of Social  Gatherings with Friends and Family: Once a week    Attends Religious Services: More than 4 times per year    Active Member of Clubs or Organizations: No    Attends Engineer, structural: Not on file    Marital Status: Married    Family History  Problem Relation Age of Onset   Cancer Father        lung CA    Health Maintenance  Topic Date Due   COVID-19 Vaccine (4 - 2023-24 season) 12/05/2022   Zoster Vaccines- Shingrix (1 of 2) 06/07/2023 (Originally 02/14/2006)   INFLUENZA VACCINE  04/25/2023   Medicare Annual Wellness (AWV)  07/19/2023   OPHTHALMOLOGY EXAM  08/10/2023   HEMOGLOBIN A1C  09/06/2023    Diabetic kidney evaluation - eGFR measurement  12/05/2023   Diabetic kidney evaluation - Urine ACR  12/05/2023   FOOT EXAM  12/05/2023   Fecal DNA (Cologuard)  08/24/2025   DTaP/Tdap/Td (3 - Td or Tdap) 07/06/2026   Pneumonia Vaccine 27+ Years old  Completed   Hepatitis C Screening  Completed   HPV VACCINES  Aged Out     ----------------------------------------------------------------------------------------------------------------------------------------------------------------------------------------------------------------- Physical Exam There were no vitals taken for this visit.  Physical Exam Constitutional:      Appearance: Normal appearance.  HENT:     Head: Normocephalic and atraumatic.  Cardiovascular:     Rate and Rhythm: Normal rate and regular rhythm.  Pulmonary:     Effort: Pulmonary effort is normal.     Breath sounds: Normal breath sounds.  Musculoskeletal:     Cervical back: Neck supple.  Neurological:     Mental Status: He is alert.    Procedure note: Subacromial injection discussed with patient including potential side effects and complications.  These include but are not limited to infection, bleeding, bruising, post injection flare and hypopigmentation at injection site.  He understands risk and would like to proceed with procedure.  Verbal consent was given.  Skin was prepped using chlorhexidine.  Cold spray was applied to the skin.  Using a posterior lateral approach the subacromial space was injected with 1 mL of 40 mg/mL triamcinolone and 4 mL of lidocaine.  Band-Aid applied.  He tolerated procedure well.  He did note relief immediately after injection  ------------------------------------------------------------------------------------------------------------------------------------------------------------------------------------------------------------------- Assessment and Plan  No problem-specific Assessment & Plan notes found for this  encounter.   Meds ordered this encounter  Medications   DISCONTD: triamcinolone acetonide (KENALOG-40) injection 40 mg    No follow-ups on file.    This visit occurred during the SARS-CoV-2 public health emergency.  Safety protocols were in place, including screening questions prior to the visit, additional usage of staff PPE, and extensive cleaning of exam room while observing appropriate contact time as indicated for disinfecting solutions.

## 2023-04-10 ENCOUNTER — Encounter: Payer: Self-pay | Admitting: Family Medicine

## 2023-04-11 ENCOUNTER — Other Ambulatory Visit: Payer: Self-pay | Admitting: Family Medicine

## 2023-04-11 DIAGNOSIS — E1169 Type 2 diabetes mellitus with other specified complication: Secondary | ICD-10-CM

## 2023-04-11 MED ORDER — FENOFIBRATE 145 MG PO TABS: 145.0000 mg | ORAL_TABLET | Freq: Every day | ORAL | 3 refills | Status: DC

## 2023-04-11 MED ORDER — ROSUVASTATIN CALCIUM 10 MG PO TABS
10.0000 mg | ORAL_TABLET | Freq: Every day | ORAL | 2 refills | Status: DC
Start: 2023-04-11 — End: 2023-12-24
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
## 2023-04-18 ENCOUNTER — Other Ambulatory Visit: Payer: Self-pay

## 2023-04-18 ENCOUNTER — Ambulatory Visit: Payer: Medicare HMO | Attending: Family Medicine | Admitting: Physical Therapy

## 2023-04-18 ENCOUNTER — Encounter: Payer: Self-pay | Admitting: Physical Therapy

## 2023-04-18 DIAGNOSIS — G8929 Other chronic pain: Secondary | ICD-10-CM | POA: Insufficient documentation

## 2023-04-18 DIAGNOSIS — M25611 Stiffness of right shoulder, not elsewhere classified: Secondary | ICD-10-CM | POA: Diagnosis not present

## 2023-04-18 DIAGNOSIS — M25511 Pain in right shoulder: Secondary | ICD-10-CM | POA: Diagnosis not present

## 2023-04-22 ENCOUNTER — Telehealth: Payer: Self-pay

## 2023-04-22 NOTE — Progress Notes (Signed)
   04/22/2023  Patient ID: Oscar Gallagher, male   DOB: Mar 17, 1956, 67 y.o.   MRN: [id number]  Patient outreach to make sure he had received the [employer] PAP application for Rybelsus; and he states that he has and has sent this back in.  He just started his second bottle of Rybelsus 3mg  samples provided by [facility].  I will contact the medication assistance team to see if his portion of the application has been received yet.  Patient aware to contact Dr. Anastasio Auerbach office if more samples are needed to prevent going without medication.  Lenna Gilford, PharmD, DPLA
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
## 2023-04-23 ENCOUNTER — Ambulatory Visit: Payer: Medicare HMO | Admitting: Physical Therapy

## 2023-04-23 ENCOUNTER — Other Ambulatory Visit: Payer: Self-pay | Admitting: Family Medicine

## 2023-04-23 ENCOUNTER — Other Ambulatory Visit: Payer: Self-pay

## 2023-04-23 DIAGNOSIS — G8929 Other chronic pain: Secondary | ICD-10-CM

## 2023-04-23 DIAGNOSIS — M25611 Stiffness of right shoulder, not elsewhere classified: Secondary | ICD-10-CM

## 2023-04-23 DIAGNOSIS — M25511 Pain in right shoulder: Secondary | ICD-10-CM | POA: Diagnosis not present

## 2023-04-23 MED ORDER — NAPROXEN 500 MG PO TABS: 500.0000 mg | ORAL_TABLET | Freq: Two times a day (BID) | ORAL | 1 refills | Status: DC

## 2023-04-23 MED ORDER — TIZANIDINE HCL 4 MG PO TABS: 4.0000 mg | ORAL_TABLET | Freq: Four times a day (QID) | ORAL | 0 refills | Status: DC | PRN

## 2023-04-23 NOTE — Telephone Encounter (Signed)
RECEIVED PT PGS FOR RYBELSUS FROM [employer]  I HAVE FAXED PROVIDER PGS TO OFFICE OF Oscar Gallagher.   PLEASE BE ADVISED

## 2023-04-24 DIAGNOSIS — M5136 Other intervertebral disc degeneration, lumbar region: Secondary | ICD-10-CM | POA: Diagnosis not present

## 2023-04-24 DIAGNOSIS — M48061 Spinal stenosis, lumbar region without neurogenic claudication: Secondary | ICD-10-CM | POA: Diagnosis not present

## 2023-04-24 DIAGNOSIS — M47816 Spondylosis without myelopathy or radiculopathy, lumbar region: Secondary | ICD-10-CM | POA: Diagnosis not present

## 2023-04-26 NOTE — Telephone Encounter (Signed)
RECEIVED BOTH PT PGS AND PROVIDER PAGES Submitted application for RYBELSUS to [employer] for patient assistance PROCESSING.   Phone: [phone]  PLEASE BE ADVISED

## 2023-04-29 ENCOUNTER — Telehealth: Payer: Self-pay

## 2023-04-29 NOTE — Progress Notes (Signed)
   04/29/2023  Patient ID: Oscar Gallagher, male   DOB: 01-Nov-1955, 67 y.o.   MRN: [id number]  Outreach to [employer] to check on processing status of Rybelsus application.  Representative states the image of the patient's insurance card sent in with the application was not legible.  I was able to provide this information to the program over the phone, though.  Application is now approved, and Rybelsus 7mg  should ship to [facility] in 10-14 business days.  Notifying patient to inform.  Lenna Gilford, PharmD, DPLA

## 2023-04-30 NOTE — Telephone Encounter (Signed)
Received notification from [employer] regarding approval for RYBELSUS. Patient assistance approved from 04/29/2023 to 09/24/2023.  Phone: [phone]\    Please be advised

## 2023-05-01 ENCOUNTER — Ambulatory Visit: Payer: Medicare HMO | Attending: Family Medicine | Admitting: Physical Therapy

## 2023-05-01 ENCOUNTER — Other Ambulatory Visit: Payer: Self-pay | Admitting: Family Medicine

## 2023-05-01 DIAGNOSIS — E119 Type 2 diabetes mellitus without complications: Secondary | ICD-10-CM

## 2023-05-01 DIAGNOSIS — M25611 Stiffness of right shoulder, not elsewhere classified: Secondary | ICD-10-CM | POA: Insufficient documentation

## 2023-05-01 DIAGNOSIS — G8929 Other chronic pain: Secondary | ICD-10-CM | POA: Insufficient documentation

## 2023-05-01 DIAGNOSIS — R293 Abnormal posture: Secondary | ICD-10-CM | POA: Insufficient documentation

## 2023-05-01 DIAGNOSIS — M25511 Pain in right shoulder: Secondary | ICD-10-CM | POA: Insufficient documentation

## 2023-05-01 DIAGNOSIS — M6281 Muscle weakness (generalized): Secondary | ICD-10-CM | POA: Diagnosis not present
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
## 2023-05-06 ENCOUNTER — Other Ambulatory Visit: Payer: Self-pay | Admitting: Family Medicine

## 2023-05-06 DIAGNOSIS — I10 Essential (primary) hypertension: Secondary | ICD-10-CM

## 2023-05-07 ENCOUNTER — Telehealth: Payer: Self-pay | Admitting: Family Medicine

## 2023-05-07 NOTE — Telephone Encounter (Addendum)
Pt called.  He was told by pcp that when he ran out of his rybelsus to call to get samples.  Mail order will arrive in 10-14 days.

## 2023-05-08 ENCOUNTER — Encounter: Payer: Self-pay | Admitting: Physical Therapy

## 2023-05-08 ENCOUNTER — Ambulatory Visit: Payer: Medicare HMO | Admitting: Physical Therapy

## 2023-05-08 DIAGNOSIS — M25611 Stiffness of right shoulder, not elsewhere classified: Secondary | ICD-10-CM

## 2023-05-08 DIAGNOSIS — M6281 Muscle weakness (generalized): Secondary | ICD-10-CM | POA: Diagnosis not present

## 2023-05-08 DIAGNOSIS — G8929 Other chronic pain: Secondary | ICD-10-CM

## 2023-05-08 DIAGNOSIS — R293 Abnormal posture: Secondary | ICD-10-CM

## 2023-05-08 DIAGNOSIS — M25511 Pain in right shoulder: Secondary | ICD-10-CM | POA: Diagnosis not present
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
## 2023-05-12 ENCOUNTER — Other Ambulatory Visit: Payer: Self-pay | Admitting: Family Medicine

## 2023-05-14 ENCOUNTER — Ambulatory Visit: Payer: Medicare HMO | Admitting: Physical Therapy

## 2023-05-17 ENCOUNTER — Telehealth: Payer: Self-pay

## 2023-05-17 NOTE — Telephone Encounter (Signed)
Forwarding to [place] as an Financial planner.   [employer] PAP shipment for Rybelsus 7 mg dose / 4 boxes received this morning. Contacted the patient to come and pick up their order today. Patient will stop by Monday 05/20/23. Placed at the front office cabinet. Thanks in advance.   NDC: 1610-9604-54 LOT: [id number] EXP: 2024-07-24

## 2023-05-20 NOTE — Telephone Encounter (Signed)
Patient came into office to get 4 boxes of Rybelsus, logged into the white binder upfront, thanks.

## 2023-05-23 DIAGNOSIS — M5136 Other intervertebral disc degeneration, lumbar region: Secondary | ICD-10-CM | POA: Diagnosis not present

## 2023-06-09 ENCOUNTER — Other Ambulatory Visit: Payer: Self-pay | Admitting: Family Medicine

## 2023-06-15 ENCOUNTER — Other Ambulatory Visit: Payer: Self-pay | Admitting: Family Medicine

## 2023-06-19 ENCOUNTER — Ambulatory Visit: Payer: Medicare HMO | Admitting: Family Medicine

## 2023-06-25 ENCOUNTER — Ambulatory Visit: Payer: Medicare HMO | Admitting: Sports Medicine

## 2023-06-28 ENCOUNTER — Encounter: Payer: Medicare HMO | Admitting: Sports Medicine

## 2023-07-04 ENCOUNTER — Encounter: Payer: Self-pay | Admitting: Sports Medicine

## 2023-07-04 ENCOUNTER — Ambulatory Visit: Payer: Medicare HMO | Admitting: Sports Medicine

## 2023-07-04 ENCOUNTER — Other Ambulatory Visit (INDEPENDENT_AMBULATORY_CARE_PROVIDER_SITE_OTHER): Payer: Medicare HMO

## 2023-07-04 ENCOUNTER — Other Ambulatory Visit: Payer: Medicare HMO

## 2023-07-04 DIAGNOSIS — M25511 Pain in right shoulder: Secondary | ICD-10-CM

## 2023-07-04 DIAGNOSIS — M48061 Spinal stenosis, lumbar region without neurogenic claudication: Secondary | ICD-10-CM | POA: Diagnosis not present

## 2023-07-04 DIAGNOSIS — M47816 Spondylosis without myelopathy or radiculopathy, lumbar region: Secondary | ICD-10-CM | POA: Diagnosis not present

## 2023-07-04 NOTE — Progress Notes (Addendum)
    Procedures performed today:    Procedure: Real-time Ultrasound Guided injection of the right subacromial bursa Device: Samsung HS60  Verbal informed consent obtained.  Time-out conducted.  Noted no overlying erythema, induration, or other signs of local infection.  Skin prepped in a sterile fashion.  Local anesthesia: Topical Ethyl chloride.  With sterile technique and under real time ultrasound guidance: Noted intact rotator cuff, 1 cc kenalog 40, 1 cc lidocaine, 1 cc bupivacaine injected easily into the subacromial bursa.  Completed without difficulty  Advised to call if fevers/chills, erythema, induration, drainage, or persistent bleeding.  Images permanently stored and available for review in PACS.  Impression: Technically successful ultrasound guided injection.  Independent interpretation of notes and tests performed by another provider:   MRI personally reviewed, severe biceps tendinosis, there is also interstitial tearing of the supraspinatus, infraspinatus, but no full-thickness or retracted tears.  Brief History, Exam, Impression, and Recommendations:    Right shoulder pain This pleasant 68 year old male has had years of pain in the right shoulder, localized over the deltoid and worse with abduction, he has had physical therapy without efficacy, analgesics, he has had what sounds to be a couple of unguided subacromial injections that provided temporary relief. He was referred to me for further evaluation, we talked about the anatomy and force coupling function of the rotator cuff as well as the typical treatment protocol including physical therapy, injections and potentially arthroscopy. It has been 3 months since his last injection with another provider, we will do another injection but this time with ultrasound guidance. I did explain to him that at this point with failure of 2 injections there was a high likelihood that he would need to proceed to arthroscopy, he will let me  know how things go in about 4 to 6 weeks, he will continue with home physical therapy and we can get him set up with [facility] if insufficient relief.    ____________________________________________ Oscar Gallagher. Oscar Gallagher, M.D., ABFM., CAQSM., AME. Primary Care and Sports Medicine [facility]  Adjunct Professor of Family Medicine  [employer]  Restaurant manager, fast food

## 2023-07-04 NOTE — Assessment & Plan Note (Signed)
This pleasant 67 year old male has had years of pain in the right shoulder, localized over the deltoid and worse with abduction, he has had physical therapy without efficacy, analgesics, he has had what sounds to be a couple of unguided subacromial injections that provided temporary relief. He was referred to me for further evaluation, we talked about the anatomy and force coupling function of the rotator cuff as well as the typical treatment protocol including physical therapy, injections and potentially arthroscopy. It has been 3 months since his last injection with another provider, we will do another injection but this time with ultrasound guidance. I did explain to him that at this point with failure of 2 injections there was a high likelihood that he would need to proceed to arthroscopy, he will let me know how things go in about 4 to 6 weeks, he will continue with home physical therapy and we can get him set up with [facility] if insufficient relief.

## 2023-07-05 ENCOUNTER — Other Ambulatory Visit: Payer: Medicare HMO

## 2023-07-05 DIAGNOSIS — M25511 Pain in right shoulder: Secondary | ICD-10-CM | POA: Diagnosis not present

## 2023-07-05 MED ORDER — TRIAMCINOLONE ACETONIDE 40 MG/ML IJ SUSP
40.0000 mg | Freq: Once | INTRAMUSCULAR | Status: AC
Start: 2023-07-05 — End: 2023-07-05
  Administered 2023-07-05: 40 mg via INTRAMUSCULAR

## 2023-07-05 NOTE — Progress Notes (Signed)
   07/05/2023  Patient ID: Oscar Gallagher, male   DOB: 10-17-1955, 67 y.o.   MRN: [id number]  S/O Patient outreach to follow-up on diabetes control  Diabetes Management Plan -Current regimen:  Rybelsus 7mg  daily, Xigduo XR 5/1000mg  BID -Patient receives Rybelsus through [facility] PAP and Xigduo through [employer] PAP -Checking home FBG regularly and states it is typically 100-124 -Patient does not endorse any s/sx of hypoglycemia or hyperglycemia -A1c in April 2024 7.4%  A/P  Diabetes Management Plan -Patient sees Dr. Ashley Royalty Monday 10/14 and is due for A1c -If A1c>7%, I recommend increasing Rybelsus to 14mg  daily; if dose is increased, I can assist with order change form for [facility]  Follow-up:  Patient would like next visit to be in person, so I will schedule based on any medication changes at 10/14 appointment  Lenna Gilford, PharmD, DPLA

## 2023-07-05 NOTE — Addendum Note (Signed)
Addended by: Carren Rang A on: 07/05/2023 09:50 AM   Modules accepted: Orders

## 2023-07-08 ENCOUNTER — Encounter: Payer: Self-pay | Admitting: Family Medicine

## 2023-07-08 ENCOUNTER — Ambulatory Visit (INDEPENDENT_AMBULATORY_CARE_PROVIDER_SITE_OTHER): Payer: Medicare HMO | Admitting: Family Medicine

## 2023-07-08 VITALS — BP 132/83 | HR 71 | Ht 70.0 in | Wt 220.8 lb

## 2023-07-08 DIAGNOSIS — Z794 Long term (current) use of insulin: Secondary | ICD-10-CM

## 2023-07-08 DIAGNOSIS — E039 Hypothyroidism, unspecified: Secondary | ICD-10-CM

## 2023-07-08 DIAGNOSIS — I10 Essential (primary) hypertension: Secondary | ICD-10-CM | POA: Diagnosis not present

## 2023-07-08 DIAGNOSIS — E119 Type 2 diabetes mellitus without complications: Secondary | ICD-10-CM

## 2023-07-08 DIAGNOSIS — E781 Pure hyperglyceridemia: Secondary | ICD-10-CM | POA: Diagnosis not present

## 2023-07-08 DIAGNOSIS — Z23 Encounter for immunization: Secondary | ICD-10-CM | POA: Diagnosis not present

## 2023-07-08 LAB — POCT GLYCOSYLATED HEMOGLOBIN (HGB A1C): Hemoglobin A1C: 7.6 % — AB (ref 4.0–5.6)

## 2023-07-08 MED ORDER — SYNTHROID 88 MCG PO TABS: 88.0000 ug | ORAL_TABLET | Freq: Every day | ORAL | 0 refills | Status: DC

## 2023-07-08 MED ORDER — RYBELSUS 14 MG PO TABS: 14.0000 mg | ORAL_TABLET | Freq: Every day | ORAL | Status: DC

## 2023-07-08 MED ORDER — TADALAFIL 5 MG PO TABS: 5.0000 mg | ORAL_TABLET | Freq: Every day | ORAL | 3 refills | Status: DC

## 2023-07-08 MED ORDER — NAPROXEN 500 MG PO TABS: 500.0000 mg | ORAL_TABLET | Freq: Two times a day (BID) | ORAL | 1 refills | Status: DC

## 2023-07-08 MED ORDER — TRIAMTERENE-HCTZ 37.5-25 MG PO TABS
1.0000 | ORAL_TABLET | Freq: Every day | ORAL | 0 refills | Status: DC
Start: 2023-07-08 — End: 2023-10-17

## 2023-07-08 NOTE — Progress Notes (Signed)
Oscar Gallagher - 67 y.o. male MRN [id number]  Date of birth: 1955/11/04  Subjective Chief Complaint  Patient presents with   Medical Management of Chronic Issues    DM last A1C 7.4    HPI Oscar Gallagher is a 67 y.o. male here today for follow up visit.   Reports that he is doing pretty well  Remains on xigduo and Rybelsus for glucose management.  Receiving medications through PAP.  A1c is increased some today at 7.6%.  Overall he is tolerating medications pretty well.   He does have some constipation from Rybelsus.    BP is well controlled with ramipril and maxzide.  No significant side effects from medication.  Denies chest pain, shortness of breath, palpitations, headache or vision changes.    Doing well with current strength of levothyroxine.   ROS:  A comprehensive ROS was completed and negative except as noted per HPI  Allergies  Allergen Reactions   Aspirin Other (See Comments)    Reports a history of Peptic Ulcer Disease    Past Medical History:  Diagnosis Date   Diabetes mellitus without complication (HCC)    Hyperlipidemia    Hypertension    Stroke [facility])     Past Surgical History:  Procedure Laterality Date   CARPAL TUNNEL RELEASE Right 02/10/2019   ELBOW FRACTURE SURGERY Left    Prosthesis on radial bone   HERNIA REPAIR      Social History   Socioeconomic History   Marital status: Married    Spouse name: Not on file   Number of children: Not on file   Years of education: Not on file   Highest education level: 12th grade  Occupational History   Not on file  Tobacco Use   Smoking status: Never   Smokeless tobacco: Never  Substance and Sexual Activity   Alcohol use: No   Drug use: No   Sexual activity: Not on file  Other Topics Concern   Not on file  Social History Narrative   Not on file   Social Determinants of Health   Financial Resource Strain: Low Risk  (07/05/2023)   Overall Financial Resource Strain (CARDIA)    Difficulty of Paying  Living Expenses: Not very hard  Recent Concern: Financial Resource Strain - Medium Risk (04/21/2023)   Received from [facility]   Overall Financial Resource Strain (CARDIA)    Difficulty of Paying Living Expenses: Somewhat hard  Food Insecurity: No Food Insecurity (07/05/2023)   Hunger Vital Sign    Worried About Running Out of Food in the Last Year: Never true    Ran Out of Food in the Last Year: Never true  Transportation Needs: No Transportation Needs (07/05/2023)   PRAPARE - Administrator, Civil Service (Medical): No    Lack of Transportation (Non-Medical): No  Physical Activity: Sufficiently Active (07/05/2023)   Exercise Vital Sign    Days of Exercise per Week: 7 days    Minutes of Exercise per Session: 30 min  Recent Concern: Physical Activity - Insufficiently Active (04/21/2023)   Received from [facility]   Exercise Vital Sign    Days of Exercise per Week: 1 day    Minutes of Exercise per Session: 30 min  Stress: No Stress Concern Present (07/05/2023)   [employer] of Occupational Health - Occupational Stress Questionnaire    Feeling of Stress : Only a little  Social Connections: Moderately Integrated (07/05/2023)   Social Connection and Isolation Panel [NHANES]  Frequency of Communication with Friends and Family: More than three times a week    Frequency of Social Gatherings with Friends and Family: Once a week    Attends Religious Services: More than 4 times per year    Active Member of Clubs or Organizations: No    Attends Engineer, structural: Not on file    Marital Status: Married    Family History  Problem Relation Age of Onset   Cancer Father        lung CA    Health Maintenance  Topic Date Due   Zoster Vaccines- Shingrix (1 of 2) Never done   INFLUENZA VACCINE  04/25/2023   COVID-19 Vaccine (4 - 2023-24 season) 05/26/2023   Medicare Annual Wellness (AWV)  07/19/2023   OPHTHALMOLOGY EXAM  08/10/2023   HEMOGLOBIN A1C   09/06/2023   Diabetic kidney evaluation - eGFR measurement  12/05/2023   Diabetic kidney evaluation - Urine ACR  12/05/2023   FOOT EXAM  12/05/2023   Fecal DNA (Cologuard)  08/24/2025   DTaP/Tdap/Td (3 - Td or Tdap) 07/06/2026   Pneumonia Vaccine 71+ Years old  Completed   Hepatitis C Screening  Completed   HPV VACCINES  Aged Out     ----------------------------------------------------------------------------------------------------------------------------------------------------------------------------------------------------------------- Physical Exam BP 132/83 (BP Location: Left Arm, Patient Position: Sitting, Cuff Size: Large)   Pulse 71   Ht 5\' 10"  (1.778 m)   Wt 220 lb 12 oz (100.1 kg)   SpO2 97%   BMI 31.67 kg/m   Physical Exam Constitutional:      Appearance: Normal appearance.  Cardiovascular:     Rate and Rhythm: Normal rate and regular rhythm.  Pulmonary:     Effort: Pulmonary effort is normal.     Breath sounds: Normal breath sounds.  Neurological:     Mental Status: He is alert.  Psychiatric:        Mood and Affect: Mood normal.        Behavior: Behavior normal.    ------------------------------------------------------------------------------------------------------------------------------------------------------------------------------------------------------------------- Assessment and Plan  Type 2 diabetes mellitus without complication, with long-term current use of insulin (HCC) A1c is increased.  Encouraged dietary changes.  Increase Rybelsus to 14mg  daily.  Recommend adding fiber supplement and increased fluids to help with constipation.    Essential hypertension Blood pressure mains fairly well-controlled.  Continue current medications for management of hypertension.  Acquired hypothyroidism Doing well with current strength of levothyroxine.  Update TSH  Hypertriglyceridemia .  Encouraged dietary change.  Continue Tricor.   Meds ordered this  encounter  Medications   triamterene-hydrochlorothiazide (MAXZIDE-25) 37.5-25 MG tablet    Sig: Take 1 tablet by mouth daily.    Dispense:  90 tablet    Refill:  0   tadalafil (CIALIS) 5 MG tablet    Sig: Take 1 tablet (5 mg total) by mouth daily.    Dispense:  90 tablet    Refill:  3    Email: [email or web address]   SYNTHROID 88 MCG tablet    Sig: Take 1 tablet (88 mcg total) by mouth daily before breakfast.    Dispense:  90 tablet    Refill:  0   naproxen (NAPROSYN) 500 MG tablet    Sig: Take 1 tablet (500 mg total) by mouth 2 (two) times daily with a meal.    Dispense:  60 tablet    Refill:  1   Semaglutide (RYBELSUS) 14 MG TABS    Sig: Take 1 tablet (14 mg total) by mouth daily.  No follow-ups on file.    This visit occurred during the SARS-CoV-2 public health emergency.  Safety protocols were in place, including screening questions prior to the visit, additional usage of staff PPE, and extensive cleaning of exam room while observing appropriate contact time as indicated for disinfecting solutions.

## 2023-07-08 NOTE — Assessment & Plan Note (Signed)
Blood pressure mains fairly well-controlled.  Continue current medications for management of hypertension.

## 2023-07-08 NOTE — Assessment & Plan Note (Signed)
.    Encouraged dietary change.  Continue Tricor.

## 2023-07-08 NOTE — Assessment & Plan Note (Signed)
Doing well with current strength of levothyroxine.  Update TSH

## 2023-07-08 NOTE — Assessment & Plan Note (Signed)
A1c is increased.  Encouraged dietary changes.  Increase Rybelsus to 14mg  daily.  Recommend adding fiber supplement and increased fluids to help with constipation.

## 2023-07-09 LAB — TSH: TSH: 1.11 u[IU]/mL (ref 0.450–4.500)

## 2023-07-10 ENCOUNTER — Telehealth: Payer: Self-pay

## 2023-07-10 DIAGNOSIS — Z0100 Encounter for examination of eyes and vision without abnormal findings: Secondary | ICD-10-CM | POA: Diagnosis not present

## 2023-07-10 DIAGNOSIS — H2513 Age-related nuclear cataract, bilateral: Secondary | ICD-10-CM | POA: Diagnosis not present

## 2023-07-10 DIAGNOSIS — E119 Type 2 diabetes mellitus without complications: Secondary | ICD-10-CM | POA: Diagnosis not present

## 2023-07-10 LAB — HM DIABETES EYE EXAM

## 2023-07-10 NOTE — Telephone Encounter (Signed)
Received fax from [employer] regarding patients conditional approval for 2025 re-enrollment.   Patient will need to log onto [employer]'s patient support portal OR complete paper application to finalize renewal.   Will reach out to pt.

## 2023-07-11 ENCOUNTER — Encounter: Payer: Self-pay | Admitting: Family Medicine

## 2023-07-11 NOTE — Telephone Encounter (Signed)
Requesting rx rf of clortrimazole- betamethasone  Last written 09/21/2021 Last OV 07/08/2023  Upcoming appt 01/06/23 Patient states was requested for refill at his last appt with provider.

## 2023-07-12 MED ORDER — CLOTRIMAZOLE-BETAMETHASONE 1-0.05 % EX CREA: 1.0000 | TOPICAL_CREAM | Freq: Two times a day (BID) | CUTANEOUS | 1 refills | Status: DC

## 2023-07-19 NOTE — Progress Notes (Signed)
   07/19/2023  Patient ID: Oscar Gallagher, male   DOB: 02/24/1956, 67 y.o.   MRN: [id number]  Contacted [facility] PAP to check on processing of Rybelsus 14mg , and the program has not yet received the order change form for this strength.  Initiating updated order change form and faxing to Dr. Durene Romans for completion.  I will follow-up on the status next week to verify this is in process with [employer].  Lenna Gilford, PharmD, DPLA

## 2023-07-23 ENCOUNTER — Ambulatory Visit (INDEPENDENT_AMBULATORY_CARE_PROVIDER_SITE_OTHER): Payer: Medicare HMO | Admitting: Family Medicine

## 2023-07-23 VITALS — BP 124/68 | HR 77 | Ht 70.0 in | Wt 221.0 lb

## 2023-07-23 DIAGNOSIS — Z0000 Encounter for general adult medical examination without abnormal findings: Secondary | ICD-10-CM | POA: Diagnosis not present

## 2023-07-23 NOTE — Patient Instructions (Addendum)
MEDICARE ANNUAL WELLNESS VISIT Health Maintenance Summary and Written Plan of Care  Oscar Gallagher ,  Thank you for allowing me to perform your Medicare Annual Wellness Visit and for your ongoing commitment to your health.   Health Maintenance & Immunization History Health Maintenance  Topic Date Due   COVID-19 Vaccine (4 - 2023-24 season) 08/08/2023 (Originally 05/26/2023)   Zoster Vaccines- Shingrix (1 of 2) 10/23/2023 (Originally 02/14/2006)   Diabetic kidney evaluation - eGFR measurement  12/05/2023   Diabetic kidney evaluation - Urine ACR  12/05/2023   FOOT EXAM  12/05/2023   HEMOGLOBIN A1C  01/06/2024   OPHTHALMOLOGY EXAM  07/09/2024   Medicare Annual Wellness (AWV)  07/22/2024   Fecal DNA (Cologuard)  08/24/2025   DTaP/Tdap/Td (3 - Td or Tdap) 07/06/2026   Pneumonia Vaccine 40+ Years old  Completed   INFLUENZA VACCINE  Completed   Hepatitis C Screening  Completed   HPV VACCINES  Aged Out   Immunization History  Administered Date(s) Administered   Fluad Quad(high Dose 65+) 07/12/2021, 06/24/2022   Fluad Trivalent(High Dose 65+) 07/08/2023   Influenza,inj,Quad PF,6+ Mos 05/29/2018, 06/07/2020   Influenza-Unspecified 06/24/2014, 06/25/2015, 07/06/2016, 06/11/2017, 07/14/2019, 07/15/2019, 07/18/2022   PFIZER(Purple Top)SARS-COV-2 Vaccination 12/13/2019, 01/03/2020   PNEUMOCOCCAL CONJUGATE-20 09/21/2021   Pfizer(Comirnaty)Fall Seasonal Vaccine 12 years and older 08/06/2022   Pneumococcal Polysaccharide-23 09/24/2010   Tdap 11/08/2014, 07/06/2016    These are the patient goals that we discussed:  Goals Addressed               This Visit's Progress     Patient Stated (pt-stated)        Patient stated that he would like to get to 200 lbs.         This is a list of Health Maintenance Items that are overdue or due now: Shingles vaccine    Orders/Referrals Placed Today: No orders of the defined types were placed in this encounter.  (Contact our referral department  at [phone] if you have not spoken with someone about your referral appointment within the next 5 days)    Follow-up Plan Follow-up with Everrett Coombe, DO as planned Schedule shingles vaccine at the pharmacy. Medicare wellness visit in one year.  AVS printed and given to the patient.      Health Maintenance, Male Adopting a healthy lifestyle and getting preventive care are important in promoting health and wellness. Ask your health care provider about: The right schedule for you to have regular tests and exams. Things you can do on your own to prevent diseases and keep yourself healthy. What should I know about diet, weight, and exercise? Eat a healthy diet  Eat a diet that includes plenty of vegetables, fruits, low-fat dairy products, and lean protein. Do not eat a lot of foods that are high in solid fats, added sugars, or sodium. Maintain a healthy weight Body mass index (BMI) is a measurement that can be used to identify possible weight problems. It estimates body fat based on height and weight. Your health care provider can help determine your BMI and help you achieve or maintain a healthy weight. Get regular exercise Get regular exercise. This is one of the most important things you can do for your health. Most adults should: Exercise for at least 150 minutes each week. The exercise should increase your heart rate and make you sweat (moderate-intensity exercise). Do strengthening exercises at least twice a week. This is in addition to the moderate-intensity exercise. Spend less time sitting. Even  light physical activity can be beneficial. Watch cholesterol and blood lipids Have your blood tested for lipids and cholesterol at 67 years of age, then have this test every 5 years. You may need to have your cholesterol levels checked more often if: Your lipid or cholesterol levels are high. You are older than 67 years of age. You are at high risk for heart disease. What should I  know about cancer screening? Many types of cancers can be detected early and may often be prevented. Depending on your health history and family history, you may need to have cancer screening at various ages. This may include screening for: Colorectal cancer. Prostate cancer. Skin cancer. Lung cancer. What should I know about heart disease, diabetes, and high blood pressure? Blood pressure and heart disease High blood pressure causes heart disease and increases the risk of stroke. This is more likely to develop in people who have high blood pressure readings or are overweight. Talk with your health care provider about your target blood pressure readings. Have your blood pressure checked: Every 3-5 years if you are 59-52 years of age. Every year if you are 19 years old or older. If you are between the ages of 33 and 33 and are a current or former smoker, ask your health care provider if you should have a one-time screening for abdominal aortic aneurysm (AAA). Diabetes Have regular diabetes screenings. This checks your fasting blood sugar level. Have the screening done: Once every three years after age 74 if you are at a normal weight and have a low risk for diabetes. More often and at a younger age if you are overweight or have a high risk for diabetes. What should I know about preventing infection? Hepatitis B If you have a higher risk for hepatitis B, you should be screened for this virus. Talk with your health care provider to find out if you are at risk for hepatitis B infection. Hepatitis C Blood testing is recommended for: Everyone born from [birth year] through 1965. Anyone with known risk factors for hepatitis C. Sexually transmitted infections (STIs) You should be screened each year for STIs, including gonorrhea and chlamydia, if: You are sexually active and are younger than 67 years of age. You are older than 67 years of age and your health care provider tells you that you are at risk  for this type of infection. Your sexual activity has changed since you were last screened, and you are at increased risk for chlamydia or gonorrhea. Ask your health care provider if you are at risk. Ask your health care provider about whether you are at high risk for HIV. Your health care provider may recommend a prescription medicine to help prevent HIV infection. If you choose to take medicine to prevent HIV, you should first get tested for HIV. You should then be tested every 3 months for as long as you are taking the medicine. Follow these instructions at home: Alcohol use Do not drink alcohol if your health care provider tells you not to drink. If you drink alcohol: Limit how much you have to 0-2 drinks a day. Know how much alcohol is in your drink. In the [place] one drink equals one 12 oz bottle of beer (355 mL), one 5 oz glass of wine (148 mL), or one 1 oz glass of hard liquor (44 mL). Lifestyle Do not use any products that contain nicotine or tobacco. These products include cigarettes, chewing tobacco, and vaping devices, such as e-cigarettes. If you  need help quitting, ask your health care provider. Do not use street drugs. Do not share needles. Ask your health care provider for help if you need support or information about quitting drugs. General instructions Schedule regular health, dental, and eye exams. Stay current with your vaccines. Tell your health care provider if: You often feel depressed. You have ever been abused or do not feel safe at home. Summary Adopting a healthy lifestyle and getting preventive care are important in promoting health and wellness. Follow your health care provider's instructions about healthy diet, exercising, and getting tested or screened for diseases. Follow your health care provider's instructions on monitoring your cholesterol and blood pressure. This information is not intended to replace advice given to you by your health care provider. Make  sure you discuss any questions you have with your health care provider. Document Revised: 01/30/2021 Document Reviewed: 01/30/2021 Elsevier Patient Education  2024 [employer].

## 2023-07-23 NOTE — Progress Notes (Signed)
MEDICARE ANNUAL WELLNESS VISIT  07/23/2023  Subjective:  Oscar Gallagher is a 67 y.o. male patient of Everrett Coombe, DO who had a Medicare Annual Wellness Visit today. Jamaul is Retired and lives with their family. he has 1 child. he reports that he is socially active and does interact with friends/family regularly. he is moderately physically active and enjoys wood working, gardening and fishing.  Patient Care Team: Everrett Coombe, DO as PCP - General (Family Medicine) Lenna Gilford, [facility] (Pharmacist)     07/23/2023    3:04 PM 04/18/2023   10:27 AM 08/09/2022    2:54 PM 07/31/2022   11:30 AM 11/06/2021    1:50 PM 07/12/2021    3:43 PM 11/16/2018    6:11 PM  Advanced Directives  Does Patient Have a Medical Advance Directive? No No No No No No No  Would patient like information on creating a medical advance directive? No - Patient declined No - Patient declined No - Patient declined No - Patient declined No - Patient declined No - Patient declined     Hospital Utilization Over the Past 12 Months: # of hospitalizations or ER visits: 1 # of surgeries: 0  Review of Systems    Patient reports that his overall health is better when compared to last year.  Review of Systems: History obtained from chart review and the patient  All other systems negative.  Pain Assessment Pain : No/denies pain     Current Medications & Allergies (verified) Allergies as of 07/23/2023       Reactions   Aspirin Other (See Comments)   Reports a history of Peptic Ulcer Disease        Medication List        Accurate as of July 23, 2023  3:19 PM. If you have any questions, ask your nurse or doctor.          STOP taking these medications    tiZANidine 4 MG tablet Commonly known as: Zanaflex       TAKE these medications    clotrimazole-betamethasone cream Commonly known as: LOTRISONE Apply 1 Application topically 2 (two) times daily.   famotidine 40 MG tablet Commonly  known as: PEPCID TAKE 1 TABLET BY MOUTH EVERY DAY   fenofibrate 145 MG tablet Commonly known as: TRICOR Take 1 tablet (145 mg total) by mouth daily.   FREESTYLE TEST STRIPS test strip Generic drug: glucose blood Use as instructed   naproxen 500 MG tablet Commonly known as: NAPROSYN Take 1 tablet (500 mg total) by mouth 2 (two) times daily with a meal.   pantoprazole 40 MG tablet Commonly known as: PROTONIX TAKE 1 TABLET BY MOUTH EVERY DAY   ramipril 10 MG capsule Commonly known as: ALTACE TAKE 1 CAPSULE BY MOUTH EVERY DAY   rosuvastatin 10 MG tablet Commonly known as: CRESTOR Take 1 tablet (10 mg total) by mouth daily.   Rybelsus 14 MG Tabs Generic drug: Semaglutide Take 1 tablet (14 mg total) by mouth daily.   Synthroid 88 MCG tablet Generic drug: levothyroxine Take 1 tablet (88 mcg total) by mouth daily before breakfast.   tadalafil 5 MG tablet Commonly known as: CIALIS Take 1 tablet (5 mg total) by mouth daily.   triamterene-hydrochlorothiazide 37.5-25 MG tablet Commonly known as: MAXZIDE-25 Take 1 tablet by mouth daily.   triazolam 0.25 MG tablet Commonly known as: Halcion Take 1 hour prior to MRI   Xigduo XR 01-999 MG Tb24 Generic drug: Dapagliflozin Pro-metFORMIN ER TAKE 1 TABLET  BY MOUTH IN THE MORNING AND IN THE EVENING        History (reviewed): Past Medical History:  Diagnosis Date   Anemia    Taking Vitamin D   Arthritis    Asthma    Diabetes mellitus without complication (HCC)    GERD (gastroesophageal reflux disease)    Hyperlipidemia    Hypertension    Stroke [facility])    Thyroid disease    Ulcer    Past Surgical History:  Procedure Laterality Date   CARPAL TUNNEL RELEASE Right 02/10/2019   ELBOW FRACTURE SURGERY Left    Prosthesis on radial bone   HERNIA REPAIR     JOINT REPLACEMENT     Family History  Problem Relation Age of Onset   Cancer Father        lung CA   Diabetes Mother    COPD Brother    Drug abuse Brother     Early death Brother    Early death Brother    Social History   Socioeconomic History   Marital status: Married    Spouse name: Kennon Rounds   Number of children: 1   Years of education: 12   Highest education level: 12th grade  Occupational History   Occupation: Retired  Tobacco Use   Smoking status: Never   Smokeless tobacco: Never  Vaping Use   Vaping status: Never Used  Substance and Sexual Activity   Alcohol use: No   Drug use: No   Sexual activity: Not Currently  Other Topics Concern   Not on file  Social History Narrative   Lives with wife, daughter and grand-daughter. He enjoys wood working, gardening and fishing.   Social Determinants of Health   Financial Resource Strain: Low Risk  (07/19/2023)   Overall Financial Resource Strain (CARDIA)    Difficulty of Paying Living Expenses: Not hard at all  Recent Concern: Financial Resource Strain - Medium Risk (04/21/2023)   Received from [facility]   Overall Financial Resource Strain (CARDIA)    Difficulty of Paying Living Expenses: Somewhat hard  Food Insecurity: No Food Insecurity (07/19/2023)   Hunger Vital Sign    Worried About Running Out of Food in the Last Year: Never true    Ran Out of Food in the Last Year: Never true  Transportation Needs: No Transportation Needs (07/19/2023)   PRAPARE - Administrator, Civil Service (Medical): No    Lack of Transportation (Non-Medical): No  Physical Activity: Insufficiently Active (07/23/2023)   Exercise Vital Sign    Days of Exercise per Week: 2 days    Minutes of Exercise per Session: 30 min  Stress: No Stress Concern Present (07/19/2023)   [employer] of Occupational Health - Occupational Stress Questionnaire    Feeling of Stress : Only a little  Social Connections: Unknown (07/23/2023)   Social Connection and Isolation Panel [NHANES]    Frequency of Communication with Friends and Family: More than three times a week    Frequency of Social Gatherings  with Friends and Family: Twice a week    Attends Religious Services: More than 4 times per year    Active Member of [employer] or Organizations: Patient declined    Attends Banker Meetings: Patient declined    Marital Status: Married    Activities of Daily Living    07/19/2023    9:18 AM  In your present state of health, do you have any difficulty performing the following activities:  Hearing? 0  Vision? 0  Difficulty concentrating or making decisions? 0  Walking or climbing stairs? 0  Dressing or bathing? 0  Doing errands, shopping? 0  Preparing Food and eating ? N  Using the Toilet? N  In the past six months, have you accidently leaked urine? N  Do you have problems with loss of bowel control? N  Managing your Medications? N  Managing your Finances? N  Housekeeping or managing your Housekeeping? N    Patient Education/Literacy How often do you need to have someone help you when you read instructions, pamphlets, or other written materials from your doctor or pharmacy?: 1 - Never What is the last grade level you completed in school?: several college courses  Exercise    Diet Patient reports consuming  0-1  meals a day and 1 snack(s) a day Patient reports that his primary diet is: Regular Patient reports that she does have regular access to food.   Depression Screen    07/23/2023    3:02 PM 03/07/2023   11:29 AM 12/05/2022    8:49 AM 08/06/2022   11:21 AM 08/06/2022   11:02 AM 07/18/2022   11:09 AM 01/30/2022    9:07 AM  PHQ 2/9 Scores  PHQ - 2 Score 0 0 0 0 0 0 0     Fall Risk    07/23/2023    3:01 PM 07/19/2023    9:18 AM 03/07/2023   11:29 AM 12/05/2022    8:49 AM 08/06/2022   11:21 AM  Fall Risk   Falls in the past year? 0 0 0 0 0  Number falls in past yr: 0 0 0 0 0  Injury with Fall? 0 0 0 0 0  Risk for fall due to : No Fall Risks  No Fall Risks No Fall Risks No Fall Risks  Follow up Falls evaluation completed  Falls evaluation completed Falls  evaluation completed Falls evaluation completed     Objective:   BP 124/68 (BP Location: Left Arm, Patient Position: Sitting, Cuff Size: Large)   Pulse 77   Ht 5\' 10"  (1.778 m)   Wt 221 lb (100.2 kg)   SpO2 100%   BMI 31.71 kg/m   Last Weight  Most recent update: 07/23/2023  2:57 PM    Weight  100.2 kg (221 lb)             Body mass index is 31.71 kg/m.  Hearing/Vision  Jameson did not have difficulty with hearing/understanding during the face-to-face interview Bryland did not have difficulty with his vision during the face-to-face interview Reports that he has had a formal eye exam by an eye care professional within the past year Reports that he has not had a formal hearing evaluation within the past year  Cognitive Function:    07/23/2023    3:04 PM 07/18/2022   11:10 AM  6CIT Screen  What Year? 0 points 0 points  What month? 0 points 0 points  What time? 0 points 0 points  Count back from 20 0 points 0 points  Months in reverse 0 points 0 points  Repeat phrase 2 points 4 points  Total Score 2 points 4 points    Normal Cognitive Function Screening: Yes (Normal:0-7, Significant for Dysfunction: >8)  Immunization & Health Maintenance Record Immunization History  Administered Date(s) Administered   Fluad Quad(high Dose 65+) 07/12/2021, 06/24/2022   Fluad Trivalent(High Dose 65+) 07/08/2023   Influenza,inj,Quad PF,6+ Mos 05/29/2018, 06/07/2020   Influenza-Unspecified 06/24/2014, 06/25/2015, 07/06/2016, 06/11/2017, 07/14/2019,  07/15/2019, 07/18/2022   PFIZER(Purple Top)SARS-COV-2 Vaccination 12/13/2019, 01/03/2020   PNEUMOCOCCAL CONJUGATE-20 09/21/2021   Pfizer(Comirnaty)Fall Seasonal Vaccine 12 years and older 08/06/2022   Pneumococcal Polysaccharide-23 09/24/2010   Tdap 11/08/2014, 07/06/2016    Health Maintenance  Topic Date Due   COVID-19 Vaccine (4 - 2023-24 season) 08/08/2023 (Originally 05/26/2023)   Zoster Vaccines- Shingrix (1 of 2) 10/23/2023  (Originally 02/14/2006)   Diabetic kidney evaluation - eGFR measurement  12/05/2023   Diabetic kidney evaluation - Urine ACR  12/05/2023   FOOT EXAM  12/05/2023   HEMOGLOBIN A1C  01/06/2024   OPHTHALMOLOGY EXAM  07/09/2024   Medicare Annual Wellness (AWV)  07/22/2024   Fecal DNA (Cologuard)  08/24/2025   DTaP/Tdap/Td (3 - Td or Tdap) 07/06/2026   Pneumonia Vaccine 22+ Years old  Completed   INFLUENZA VACCINE  Completed   Hepatitis C Screening  Completed   HPV VACCINES  Aged Out       Assessment  This is a routine wellness examination for [facility].  Health Maintenance: Due or Overdue There are no preventive care reminders to display for this patient.   [facility] does not need a referral for [employer] Assistance: Care Management:   no Social Work:    no Prescription Assistance:  no Nutrition/Diabetes Education:  no   Plan:  Personalized Goals  Goals Addressed               This Visit's Progress     Patient Stated (pt-stated)        Patient stated that he would like to get to 200 lbs.       Personalized Health Maintenance & Screening Recommendations  Shingles vaccine  Lung Cancer Screening Recommended: no (Low Dose CT Chest recommended if Age 70-80 years, 20 pack-year currently smoking OR have quit w/in past 15 years) Hepatitis C Screening recommended: no HIV Screening recommended: no  Advanced Directives: Written information was not given per the patient's request.  Referrals & Orders No orders of the defined types were placed in this encounter.   Follow-up Plan Follow-up with Everrett Coombe, DO as planned Schedule shingles vaccine at the pharmacy. Medicare wellness visit in one year.  AVS printed and given to the patient.   I have personally reviewed and noted the following in the patient's chart:   Medical and social history Use of alcohol, tobacco or illicit drugs  Current medications and supplements Functional ability and  status Nutritional status Physical activity Advanced directives List of other physicians Hospitalizations, surgeries, and ER visits in previous 12 months Vitals Screenings to include cognitive, depression, and falls Referrals and appointments  In addition, I have reviewed and discussed with patient certain preventive protocols, quality metrics, and best practice recommendations. A written personalized care plan for preventive services as well as general preventive health recommendations were provided to patient.     Modesto Charon, RN BSN  07/23/2023

## 2023-07-29 NOTE — Progress Notes (Signed)
   07/29/2023  Patient ID: Oscar Gallagher, male   DOB: 10/14/1955, 67 y.o.   MRN: [id number]  Contacted Novo PAP, and patient has been approved for Rybelsus 14mg  PAP; the medication should arrive at [facility] in 10-14 business days.  Novo PAP re-enrollment for 2025 is due by 11/30.  Contacting patient to schedule DM f/u and see if he needs any assistance with PAP renewals for Novo or AZ&Me (Xigduo).  Lenna Gilford, PharmD, DPLA

## 2023-08-01 ENCOUNTER — Telehealth: Payer: Self-pay

## 2023-08-01 NOTE — Telephone Encounter (Signed)
Forwarding to [facility] as an Financial planner.  Novo Nordisk PAP shipment for Rybelsus 14 mg dose / 2 boxes received this morning. I've contacted the patient to come and pick up their order today. Placed in the front office cabinet with patient identifier. Thanks in advance.   NDC: 4098-1191-47 LOT: [id number] EXP: 2024-12-22

## 2023-08-05 ENCOUNTER — Other Ambulatory Visit: Payer: Medicare HMO

## 2023-08-05 NOTE — Progress Notes (Unsigned)
   08/05/2023  Patient ID: Oscar Gallagher, male   DOB: Jan 23, 1956, 67 y.o.   MRN: [id number]  S/O Telephone visit to follow-up on management of T2DM  Diabetes Management Plan -Current medications:  Rybelsus 14mg  daily, Xigduo 5/1000mg  BID -Patient checks home FBG regularly and endorses values 97-110 -A1c 7.6% on 10/14 -Patient states he has been on Xigduo for years now, but he believes this medication is causing intermittent n/v and diarrhea -Increased Rybelsus dose from 7mg  to 14mg  this week and endorses tolerating well so far  A/P  Diabetes Management Plan -Decrease Xigduo 5/1000mg  to 1 tablet daily and continue Rybelsus 14mg  daily -Monitor effect on BG and any improvement or worsening in n/v and diarrhea  Follow-up:  4 weeks  Lenna Gilford, PharmD, DPLA

## 2023-08-08 ENCOUNTER — Ambulatory Visit (INDEPENDENT_AMBULATORY_CARE_PROVIDER_SITE_OTHER): Payer: Medicare HMO | Admitting: Sports Medicine

## 2023-08-08 DIAGNOSIS — M25511 Pain in right shoulder: Secondary | ICD-10-CM

## 2023-08-08 NOTE — Assessment & Plan Note (Signed)
This is a very pleasant 67 year old male has had years of pain right shoulder localized over the deltoid and worse with abduction, he has seen a couple of the providers and had some unguided subacromial injections that only provided temporary relief. We saw him and did an ultrasound-guided injection, we also recommended some home physical therapy. He is doing a lot better today, pain-free, but has not done the home PT. I explained the importance of avoiding overhead activities and doing the home physical therapy, and that he had a great deal of control over whether or not he would need to see me again. Return as needed.

## 2023-08-08 NOTE — Progress Notes (Signed)
    Procedures performed today:    None.  Independent interpretation of notes and tests performed by another provider:   None.  Brief History, Exam, Impression, and Recommendations:    Right shoulder pain This is a very pleasant 67 year old male has had years of pain right shoulder localized over the deltoid and worse with abduction, he has seen a couple of the providers and had some unguided subacromial injections that only provided temporary relief. We saw him and did an ultrasound-guided injection, we also recommended some home physical therapy. He is doing a lot better today, pain-free, but has not done the home PT. I explained the importance of avoiding overhead activities and doing the home physical therapy, and that he had a great deal of control over whether or not he would need to see me again. Return as needed.    ____________________________________________ Ihor Austin. Benjamin Stain, M.D., ABFM., CAQSM., AME. Primary Care and Sports Medicine [facility]  Adjunct Professor of Family Medicine  [employer]  Restaurant manager, fast food

## 2023-08-09 NOTE — Telephone Encounter (Signed)
Novo Nordisk sent Rybelsus 7 mg, 2 boxes. He is on 14 mg which he picked up last week. Dr Ashley Royalty states he can still pick up the 7 mg and take 2 to equal 14 mg. Patient agreed.  NDC 7846-9629-52 Lot [id number] Exp 09/23/2024

## 2023-08-23 NOTE — Progress Notes (Signed)
   08/23/2023  Patient ID: Oscar Gallagher, male   DOB: 1955/09/27, 67 y.o.   MRN: [id number]  Submitted 2025 Novo patient assistance re-enrollment application online for Rybelsus 14mg  daily.  Lenna Gilford, PharmD, DPLA

## 2023-08-27 ENCOUNTER — Ambulatory Visit (INDEPENDENT_AMBULATORY_CARE_PROVIDER_SITE_OTHER): Payer: Medicare HMO | Admitting: Family Medicine

## 2023-08-27 ENCOUNTER — Encounter: Payer: Self-pay | Admitting: Family Medicine

## 2023-08-27 VITALS — BP 144/82 | HR 83 | Ht 70.0 in | Wt 221.0 lb

## 2023-08-27 DIAGNOSIS — L659 Nonscarring hair loss, unspecified: Secondary | ICD-10-CM

## 2023-08-27 MED ORDER — FINASTERIDE 5 MG PO TABS: 2.5000 mg | ORAL_TABLET | Freq: Every day | ORAL | 0 refills | Status: AC

## 2023-08-27 NOTE — Assessment & Plan Note (Addendum)
His hair loss is really bothering him. 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  Recommend OTC minoxidil topical treatment and will add low dose finasteride.  He will let me know if not improving.

## 2023-08-27 NOTE — Progress Notes (Signed)
Oscar Gallagher - 67 y.o. male MRN [id number]  Date of birth: 07-26-1956  Subjective Chief Complaint  Patient presents with   Alopecia    HPI Oscar Gallagher is a 67 y.o. male here today with concern about hair loss.  He has noticed thinning of hair and hair coming out in clumps.  He did have illness a month or two ago.  No significant medication changes recently.  He had thyroid checked last month and was WNL.  He is taking a biotin supplement.   ROS:  A comprehensive ROS was completed and negative except as noted per HPI  Allergies  Allergen Reactions   Aspirin Other (See Comments)    Reports a history of Peptic Ulcer Disease    Past Medical History:  Diagnosis Date   Anemia    Taking Vitamin D   Arthritis    Asthma    Diabetes mellitus without complication (HCC)    GERD (gastroesophageal reflux disease)    Hyperlipidemia    Hypertension    Stroke [facility])    Thyroid disease    Ulcer     Past Surgical History:  Procedure Laterality Date   CARPAL TUNNEL RELEASE Right 02/10/2019   ELBOW FRACTURE SURGERY Left    Prosthesis on radial bone   HERNIA REPAIR     JOINT REPLACEMENT      Social History   Socioeconomic History   Marital status: Married    Spouse name: Kennon Rounds   Number of children: 1   Years of education: 12   Highest education level: 12th grade  Occupational History   Occupation: Retired  Tobacco Use   Smoking status: Never   Smokeless tobacco: Never  Vaping Use   Vaping status: Never Used  Substance and Sexual Activity   Alcohol use: No   Drug use: No   Sexual activity: Not Currently  Other Topics Concern   Not on file  Social History Narrative   Lives with wife, daughter and grand-daughter. He enjoys wood working, gardening and fishing.   Social Determinants of Health   Financial Resource Strain: Low Risk  (07/19/2023)   Overall Financial Resource Strain (CARDIA)    Difficulty of Paying Living Expenses: Not hard at all  Recent Concern:  Financial Resource Strain - Medium Risk (04/21/2023)   Received from [facility]   Overall Financial Resource Strain (CARDIA)    Difficulty of Paying Living Expenses: Somewhat hard  Food Insecurity: No Food Insecurity (07/19/2023)   Hunger Vital Sign    Worried About Running Out of Food in the Last Year: Never true    Ran Out of Food in the Last Year: Never true  Transportation Needs: No Transportation Needs (07/19/2023)   PRAPARE - Administrator, Civil Service (Medical): No    Lack of Transportation (Non-Medical): No  Physical Activity: Insufficiently Active (07/23/2023)   Exercise Vital Sign    Days of Exercise per Week: 2 days    Minutes of Exercise per Session: 30 min  Stress: No Stress Concern Present (07/19/2023)   [employer] of Occupational Health - Occupational Stress Questionnaire    Feeling of Stress : Only a little  Social Connections: Unknown (07/23/2023)   Social Connection and Isolation Panel [NHANES]    Frequency of Communication with Friends and Family: More than three times a week    Frequency of Social Gatherings with Friends and Family: Twice a week    Attends Religious Services: More than 4 times per year  Active Member of Clubs or Organizations: Patient declined    Attends Banker Meetings: Patient declined    Marital Status: Married    Family History  Problem Relation Age of Onset   Cancer Father        lung CA   Diabetes Mother    COPD Brother    Drug abuse Brother    Early death Brother    Early death Brother     Health Maintenance  Topic Date Due   COVID-19 Vaccine (4 - 2023-24 season) 05/26/2023   Zoster Vaccines- Shingrix (1 of 2) 10/23/2023 (Originally 02/14/2006)   Diabetic kidney evaluation - eGFR measurement  12/05/2023   Diabetic kidney evaluation - Urine ACR  12/05/2023   FOOT EXAM  12/05/2023   HEMOGLOBIN A1C  01/06/2024   OPHTHALMOLOGY EXAM  07/09/2024   Medicare Annual Wellness (AWV)  07/22/2024    Fecal DNA (Cologuard)  08/24/2025   DTaP/Tdap/Td (3 - Td or Tdap) 07/06/2026   Pneumonia Vaccine 105+ Years old  Completed   INFLUENZA VACCINE  Completed   Hepatitis C Screening  Completed   HPV VACCINES  Aged Out     ----------------------------------------------------------------------------------------------------------------------------------------------------------------------------------------------------------------- Physical Exam BP (!) 144/82 (BP Location: Left Arm, Patient Position: Sitting, Cuff Size: Normal)   Pulse 83   Ht 5\' 10"  (1.778 m)   Wt 221 lb (100.2 kg)   SpO2 98%   BMI 31.71 kg/m   Physical Exam Constitutional:      Appearance: Normal appearance.  HENT:     Head: Normocephalic and atraumatic.  Eyes:     General: No scleral icterus. Cardiovascular:     Rate and Rhythm: Normal rate and regular rhythm.  Pulmonary:     Effort: Pulmonary effort is normal.     Breath sounds: Normal breath sounds.  Neurological:     General: No focal deficit present.     Mental Status: He is alert.  Psychiatric:        Mood and Affect: Mood normal.     ------------------------------------------------------------------------------------------------------------------------------------------------------------------------------------------------------------------- Assessment and Plan  Hair loss His hair loss is really bothering him. 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  Recommend OTC minoxidil topical treatment and will add low dose finasteride.  He will let me know if not improving.    Meds ordered this encounter  Medications   finasteride (PROSCAR) 5 MG tablet    Sig: Take 0.5 tablets (2.5 mg total) by mouth daily.    Dispense:  90 tablet    Refill:  0    No follow-ups on file.    This visit occurred during the SARS-CoV-2 public health emergency.  Safety protocols were in place, including screening questions prior to the visit, additional usage of  staff PPE, and extensive cleaning of exam room while observing appropriate contact time as indicated for disinfecting solutions.

## 2023-08-27 NOTE — Patient Instructions (Signed)
Telogen Effluvium  Telogen effluvium is a condition in which the body sheds much hair. People describe that they are losing hair "from the roots" in excessive amounts. The hair loss is usually 3 to 6 months following an event. The inciting event may be childbirth, a surgery, an illness with a fever, a traumatic psychological event, weight loss, or the start of a new medication. Some people have no known inciting event. For most people, no treatment is necessary, and the hair will stop falling out and begin to regrow with time. Some women develop a chronic form of telogen effluvium in which they continue to lose hair at an accelerated rate.  

## 2023-09-02 ENCOUNTER — Other Ambulatory Visit: Payer: Self-pay

## 2023-09-02 NOTE — Progress Notes (Unsigned)
   09/02/2023  Patient ID: Oscar Gallagher, male   DOB: 19-Aug-1956, 67 y.o.   MRN: [id number]  S/O Telephone follow-up visit to address control of T2DM  Diabetes Management -Current medications:  Xigduo XR 5/1000mg  daily, Rybelsus 14mg  daily  -Patient states he has seen continued improvement with adverse GI side effects since decreasing Xigduo XR 5/1000mg  once daily -Continuing to take Rybelsus 14mg  daily and endorses very little appetite, typically only having one meal per day as well as recent hair loss.   -A1c 10/14 was 7.6%  A/P  Diabetes Management -Currently uncontrolled with A1c <7 -I recommend changing Xigduo to [place] 10mg  daily to see if elimiating metformin component helps stop stomach upset and will allow for higher dose of Farxiga since patient is only taking 1 tablet daily at this time -Discussed importance of getting daily vitamins and minerals with decreased appetite; I recommend more frequent smaller meals versus one larger meal daily -Continue daily multi vitamin and vitamin D supplementation -Patient's TSH level was normal at last visit.  I would also recommend evaluating Iron, B12, and Vitamin D to rule out any deficiencies potentially causing hair loss -Consulting Dr. Ashley Royalty regarding recommendations  Follow-up:  1/6  Lenna Gilford, PharmD, DPLA

## 2023-09-04 ENCOUNTER — Other Ambulatory Visit: Payer: Self-pay | Admitting: Family Medicine

## 2023-09-05 NOTE — Progress Notes (Signed)
   09/05/2023  Patient ID: Oscar Gallagher, male   DOB: [birth year], 67 y.o.   MRN: [id number]  Contacted [employer] to check on 2025 re-enrollment application status, and this has been approved for patient to continue to receive Rybelsus through 09/23/2024.  I also verified that they will no longer send 7mg  tablets, only 14mg .  Patient received both in November, and was instructed to take 2 tablets of the 7mg  when he ran out of 14mg  tablets.  Refill of 14mg  should arrive at [facility] the middle of January.  Lenna Gilford, PharmD, DPLA

## 2023-09-11 ENCOUNTER — Encounter: Payer: Self-pay | Admitting: Family Medicine

## 2023-09-11 DIAGNOSIS — L659 Nonscarring hair loss, unspecified: Secondary | ICD-10-CM

## 2023-09-11 NOTE — Progress Notes (Signed)
Paperwork showing under media tab.

## 2023-09-13 ENCOUNTER — Telehealth: Payer: Self-pay

## 2023-09-13 NOTE — Telephone Encounter (Signed)
Forwarding to [place] as an Financial planner.  P[place]  [employer] shipment for Rybelsus 14 mg dose / 2 boxes received this morning. Please contact the patient to come and pick up their order today. Placed in the front desk cabinet with patient identifier. Thanks in advance.   NDC: [id number] LOT: [id number] EXP: 2024-12-22

## 2023-09-18 ENCOUNTER — Other Ambulatory Visit: Payer: Self-pay | Admitting: Family Medicine

## 2023-09-18 DIAGNOSIS — K219 Gastro-esophageal reflux disease without esophagitis: Secondary | ICD-10-CM

## 2023-09-30 ENCOUNTER — Other Ambulatory Visit: Payer: Self-pay

## 2023-09-30 NOTE — Progress Notes (Signed)
   09/30/2023  Patient ID: Oscar Gallagher, male   DOB: 1956/08/12, 68 y.o.   MRN: [id number]  S/O Telephone visit to follow-up on management of T2DM  Diabetes Management Plan -Current medications:  Rybelsus  14mg  daily, Xigduo  XR 5-1000mg  daily -Patient is currently only taking 1 tablet of Xigduo  due to stomach upset caused by metformin  component of medication -Currently working to switch Xigduo  to Farxiga  10mg  daily through [employer]- patient has not yet received form mailed to his home to sign/date -Patient did just receive 3 bottles of Xigduo  from [employer] -A1c in Oct slightly increased to 7.6% from previous 7.4% -Patient does check home FBG regularly and endorses this is averaging 105-115  Hair Loss -Referral in for dermatology, but patient states he has not heard form them regarding the scheduling of an appointment -Has starting taking OTC B12 and Iron and has also ordered Nutrafol -Has not noticed improvement since starting B12 and Iron  A/P  Diabetes Management Plan -Recommend patient continues to take 1 tablet of Xigduo  until Farxiga  10mg  arrives -Patient will notify me if he has not received application for Farxiga  by the end of this week; if this has not arrived, I will arrange to see patient at [facility] next Thursday to complete paperwork and submit to [employer]  Hair Loss -Depending on when patient gets scheduled with dermatology, I would recommend labs to evaluate CMP, CBC, Vitamin D and Vitamin B12  Oscar Gallagher A Oscar Gallagher, PharmD, DPLA

## 2023-10-03 ENCOUNTER — Encounter: Payer: Self-pay | Admitting: Sports Medicine

## 2023-10-03 ENCOUNTER — Ambulatory Visit: Payer: Medicare (Managed Care)

## 2023-10-03 ENCOUNTER — Ambulatory Visit (INDEPENDENT_AMBULATORY_CARE_PROVIDER_SITE_OTHER): Payer: Medicare (Managed Care) | Admitting: Sports Medicine

## 2023-10-03 DIAGNOSIS — S46011A Strain of muscle(s) and tendon(s) of the rotator cuff of right shoulder, initial encounter: Secondary | ICD-10-CM

## 2023-10-03 DIAGNOSIS — M19011 Primary osteoarthritis, right shoulder: Secondary | ICD-10-CM

## 2023-10-03 MED ORDER — TRAMADOL HCL 50 MG PO TABS
50.0000 mg | ORAL_TABLET | Freq: Three times a day (TID) | ORAL | 0 refills | Status: DC | PRN
Start: 2023-10-03 — End: 2023-11-07

## 2023-10-03 NOTE — Progress Notes (Signed)
    Procedures performed today:    None.  Independent interpretation of notes and tests performed by another provider:   None.  Brief History, Exam, Impression, and Recommendations:    Rotator cuff tear, right This is a very pleasant 68 year old male, he has a long history of right shoulder pain, he did have some unguided subacromial injections at an outside facility without much relief, he did see PCP, got an MRI, the MRI did show expected degenerative cuff tearing, mostly interstitial as well as biceps tendinosis and tearing. We did another subacromial injection and he added home physical therapy and responded well. Unfortunately he recently had a fall, and was left with severe pain, loss of motion and weakness. On exam he has no bruising but tenderness around the humeral head, he has severe weakness to abduction and external rotation and a positive speeds test. I am concerned for a rotator cuff tear versus humeral fracture, adding x-rays and a new MRI. He will wear a sling which he has at home and I am adding tramadol  for pain. Return to see me for MRI results.    ____________________________________________ Debby PARAS. Curtis, M.D., ABFM., CAQSM., AME. Primary Care and Sports Medicine [facility]  Adjunct Professor of [facility] Medicine  [employer]  Restaurant Manager, [employer]

## 2023-10-03 NOTE — Assessment & Plan Note (Signed)
 This is a very pleasant 68 year old male, he has a long history of right shoulder pain, he did have some unguided subacromial injections at an outside facility without much relief, he did see PCP, got an MRI, the MRI did show expected degenerative cuff tearing, mostly interstitial as well as biceps tendinosis and tearing. We did another subacromial injection and he added home physical therapy and responded well. Unfortunately he recently had a fall, and was left with severe pain, loss of motion and weakness. On exam he has no bruising but tenderness around the humeral head, he has severe weakness to abduction and external rotation and a positive speeds test. I am concerned for a rotator cuff tear versus humeral fracture, adding x-rays and a new MRI. He will wear a sling which he has at home and I am adding tramadol  for pain. Return to see me for MRI results.

## 2023-10-06 ENCOUNTER — Ambulatory Visit: Payer: Medicare (Managed Care)

## 2023-10-06 ENCOUNTER — Other Ambulatory Visit: Payer: Self-pay | Admitting: Family Medicine

## 2023-10-06 DIAGNOSIS — S46011A Strain of muscle(s) and tendon(s) of the rotator cuff of right shoulder, initial encounter: Secondary | ICD-10-CM

## 2023-10-10 ENCOUNTER — Encounter: Payer: Self-pay | Admitting: Sports Medicine

## 2023-10-12 ENCOUNTER — Encounter: Payer: Self-pay | Admitting: Sports Medicine

## 2023-10-12 DIAGNOSIS — M75121 Complete rotator cuff tear or rupture of right shoulder, not specified as traumatic: Secondary | ICD-10-CM

## 2023-10-14 NOTE — Addendum Note (Signed)
Addended by: Monica Becton on: 10/14/2023 05:14 PM   Modules accepted: Orders

## 2023-10-16 ENCOUNTER — Other Ambulatory Visit: Payer: Self-pay

## 2023-10-16 ENCOUNTER — Other Ambulatory Visit: Payer: Self-pay | Admitting: Family Medicine

## 2023-10-16 DIAGNOSIS — I10 Essential (primary) hypertension: Secondary | ICD-10-CM

## 2023-10-16 NOTE — Progress Notes (Signed)
   10/16/2023  Patient ID: Oscar Gallagher, male   DOB: Sep 05, 1956, 68 y.o.   MRN: [id number]  Contacted [employer] patient assistance program to follow-up on an application for Farxiga 10 mg.  Patient has been approved to receive this through the end of 2025, but the program does need a prescription faxed to [facility]  Pending a prescription for Dr. Ashley Royalty to sign if in agreement and sending patient a MyChart message to inform him he should receive this medication in approximately 2 weeks.  I will also attempt to schedule a follow-up visit in 4 to 6 weeks to see how patient is tolerating the medication.  Lenna Gilford, PharmD, DPLA

## 2023-10-17 MED ORDER — FARXIGA 10 MG PO TABS: 10.0000 mg | ORAL_TABLET | Freq: Every day | ORAL | 3 refills | Status: DC

## 2023-10-21 DIAGNOSIS — D485 Neoplasm of uncertain behavior of skin: Secondary | ICD-10-CM | POA: Diagnosis not present

## 2023-10-21 DIAGNOSIS — L650 Telogen effluvium: Secondary | ICD-10-CM | POA: Diagnosis not present

## 2023-10-21 DIAGNOSIS — B351 Tinea unguium: Secondary | ICD-10-CM | POA: Diagnosis not present

## 2023-10-21 DIAGNOSIS — L710 Perioral dermatitis: Secondary | ICD-10-CM | POA: Diagnosis not present

## 2023-10-22 DIAGNOSIS — M25511 Pain in right shoulder: Secondary | ICD-10-CM | POA: Diagnosis not present

## 2023-10-29 NOTE — Addendum Note (Signed)
Addended by: Monica Becton on: 10/29/2023 12:04 PM   Modules accepted: Orders

## 2023-11-01 ENCOUNTER — Other Ambulatory Visit: Payer: Self-pay | Admitting: Family Medicine

## 2023-11-01 DIAGNOSIS — I10 Essential (primary) hypertension: Secondary | ICD-10-CM

## 2023-11-04 ENCOUNTER — Encounter (HOSPITAL_BASED_OUTPATIENT_CLINIC_OR_DEPARTMENT_OTHER): Payer: Self-pay | Admitting: Orthopaedic Surgery

## 2023-11-04 ENCOUNTER — Other Ambulatory Visit: Payer: Self-pay

## 2023-11-05 ENCOUNTER — Encounter (HOSPITAL_BASED_OUTPATIENT_CLINIC_OR_DEPARTMENT_OTHER)
Admission: RE | Admit: 2023-11-05 | Discharge: 2023-11-05 | Disposition: A | Discharge status: Home / Self Care | Payer: Medicare (Managed Care) | Source: Ambulatory Visit | Attending: Orthopaedic Surgery | Admitting: Orthopaedic Surgery

## 2023-11-05 DIAGNOSIS — Z01812 Encounter for preprocedural laboratory examination: Secondary | ICD-10-CM | POA: Diagnosis not present

## 2023-11-05 LAB — BASIC METABOLIC PANEL
Anion gap: 9 (ref 5–15)
BUN: 17 mg/dL (ref 8–23)
CO2: 26 mmol/L (ref 22–32)
Calcium: 9.7 mg/dL (ref 8.9–10.3)
Chloride: 103 mmol/L (ref 98–111)
Creatinine, Ser: 1.23 mg/dL (ref 0.61–1.24)
GFR, Estimated: >60 mL/min (ref >60)
Glucose, Bld: 180 mg/dL — ABNORMAL HIGH (ref 70–99)
Potassium: 4.6 mmol/L (ref 3.5–5.1)
Sodium: 138 mmol/L (ref 135–145)

## 2023-11-06 ENCOUNTER — Telehealth: Payer: Self-pay

## 2023-11-06 MED ORDER — DEXCOM G7 RECEIVER DEVI: 0 refills | Status: AC

## 2023-11-06 MED ORDER — DEXCOM G7 SENSOR MISC: 11 refills | Status: DC
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
## 2023-11-06 NOTE — Progress Notes (Signed)
   11/06/2023  Patient ID: Oscar Gallagher, male   DOB: 09-02-1956, 68 y.o.   MRN: [id number]  In basket message from patient requesting prescription for Dexcom CGM be sent to [facility].  I have sent orders for Dexcom G7 sensors and receiver under the [facility] standing order for CGM.  Informed patient to contact pharmacy to verify coverage/cost and let me know if not affordable or if he needs assistance with set up/use.  Lenna Gilford, PharmD, DPLA

## 2023-11-07 ENCOUNTER — Encounter (HOSPITAL_BASED_OUTPATIENT_CLINIC_OR_DEPARTMENT_OTHER)
Admission: RE | Disposition: A | Discharge status: Home / Self Care | Payer: Self-pay | Source: Home / Self Care | Attending: Orthopaedic Surgery

## 2023-11-07 ENCOUNTER — Encounter (HOSPITAL_BASED_OUTPATIENT_CLINIC_OR_DEPARTMENT_OTHER): Payer: Self-pay | Admitting: Orthopaedic Surgery

## 2023-11-07 ENCOUNTER — Ambulatory Visit (HOSPITAL_BASED_OUTPATIENT_CLINIC_OR_DEPARTMENT_OTHER): Payer: Medicare (Managed Care) | Admitting: Anesthesiology

## 2023-11-07 ENCOUNTER — Ambulatory Visit (HOSPITAL_BASED_OUTPATIENT_CLINIC_OR_DEPARTMENT_OTHER)
Admission: RE | Admit: 2023-11-07 | Discharge: 2023-11-07 | Disposition: A | Discharge status: Home / Self Care | Payer: Medicare (Managed Care) | Attending: Orthopaedic Surgery | Admitting: Orthopaedic Surgery

## 2023-11-07 ENCOUNTER — Other Ambulatory Visit: Payer: Self-pay

## 2023-11-07 DIAGNOSIS — M779 Enthesopathy, unspecified: Secondary | ICD-10-CM | POA: Diagnosis not present

## 2023-11-07 DIAGNOSIS — Z7984 Long term (current) use of oral hypoglycemic drugs: Secondary | ICD-10-CM | POA: Insufficient documentation

## 2023-11-07 DIAGNOSIS — M7541 Impingement syndrome of right shoulder: Secondary | ICD-10-CM | POA: Insufficient documentation

## 2023-11-07 DIAGNOSIS — X500XXA Overexertion from strenuous movement or load, initial encounter: Secondary | ICD-10-CM | POA: Insufficient documentation

## 2023-11-07 DIAGNOSIS — E079 Disorder of thyroid, unspecified: Secondary | ICD-10-CM | POA: Insufficient documentation

## 2023-11-07 DIAGNOSIS — M75101 Unspecified rotator cuff tear or rupture of right shoulder, not specified as traumatic: Secondary | ICD-10-CM | POA: Diagnosis not present

## 2023-11-07 DIAGNOSIS — S43431A Superior glenoid labrum lesion of right shoulder, initial encounter: Secondary | ICD-10-CM | POA: Diagnosis not present

## 2023-11-07 DIAGNOSIS — K219 Gastro-esophageal reflux disease without esophagitis: Secondary | ICD-10-CM | POA: Diagnosis not present

## 2023-11-07 DIAGNOSIS — M7521 Bicipital tendinitis, right shoulder: Secondary | ICD-10-CM | POA: Insufficient documentation

## 2023-11-07 DIAGNOSIS — M1990 Unspecified osteoarthritis, unspecified site: Secondary | ICD-10-CM | POA: Diagnosis not present

## 2023-11-07 DIAGNOSIS — M24111 Other articular cartilage disorders, right shoulder: Secondary | ICD-10-CM | POA: Diagnosis not present

## 2023-11-07 DIAGNOSIS — E785 Hyperlipidemia, unspecified: Secondary | ICD-10-CM | POA: Insufficient documentation

## 2023-11-07 DIAGNOSIS — M75121 Complete rotator cuff tear or rupture of right shoulder, not specified as traumatic: Secondary | ICD-10-CM | POA: Insufficient documentation

## 2023-11-07 DIAGNOSIS — S46011A Strain of muscle(s) and tendon(s) of the rotator cuff of right shoulder, initial encounter: Secondary | ICD-10-CM | POA: Diagnosis not present

## 2023-11-07 DIAGNOSIS — Z79899 Other long term (current) drug therapy: Secondary | ICD-10-CM | POA: Insufficient documentation

## 2023-11-07 DIAGNOSIS — E119 Type 2 diabetes mellitus without complications: Secondary | ICD-10-CM | POA: Diagnosis not present

## 2023-11-07 DIAGNOSIS — Z6831 Body mass index (BMI) 31.0-31.9, adult: Secondary | ICD-10-CM | POA: Insufficient documentation

## 2023-11-07 DIAGNOSIS — Z79890 Hormone replacement therapy (postmenopausal): Secondary | ICD-10-CM | POA: Diagnosis not present

## 2023-11-07 DIAGNOSIS — M19011 Primary osteoarthritis, right shoulder: Secondary | ICD-10-CM | POA: Insufficient documentation

## 2023-11-07 DIAGNOSIS — I10 Essential (primary) hypertension: Secondary | ICD-10-CM | POA: Diagnosis not present

## 2023-11-07 DIAGNOSIS — E669 Obesity, unspecified: Secondary | ICD-10-CM | POA: Insufficient documentation

## 2023-11-07 DIAGNOSIS — G8918 Other acute postprocedural pain: Secondary | ICD-10-CM | POA: Diagnosis not present

## 2023-11-07 DIAGNOSIS — Z794 Long term (current) use of insulin: Secondary | ICD-10-CM

## 2023-11-07 DIAGNOSIS — Z791 Long term (current) use of non-steroidal anti-inflammatories (NSAID): Secondary | ICD-10-CM | POA: Diagnosis not present

## 2023-11-07 HISTORY — PX: SHOULDER ARTHROSCOPY WITH SUBACROMIAL DECOMPRESSION, ROTATOR CUFF REPAIR AND BICEP TENDON REPAIR: SHX5687

## 2023-11-07 HISTORY — PX: SHOULDER ARTHROSCOPY WITH DISTAL CLAVICLE RESECTION: SHX5675

## 2023-11-07 LAB — GLUCOSE, CAPILLARY
Glucose-Capillary: 130 mg/dL — ABNORMAL HIGH (ref 70–99)
Glucose-Capillary: 133 mg/dL — ABNORMAL HIGH (ref 70–99)

## 2023-11-07 IMAGING — DX DG ELBOW COMPLETE 3+V*L*
4 series · 4 of 4 positions shown · non-contrast
Comparison: Left elbow radiographs 07/09/2019

CLINICAL DATA: Left elbow pain. Fell about 2 weeks ago. Pain in
posterior elbow. History of left elbow surgery

EXAM:
LEFT ELBOW - COMPLETE 3+ VIEW

[elbow ap]
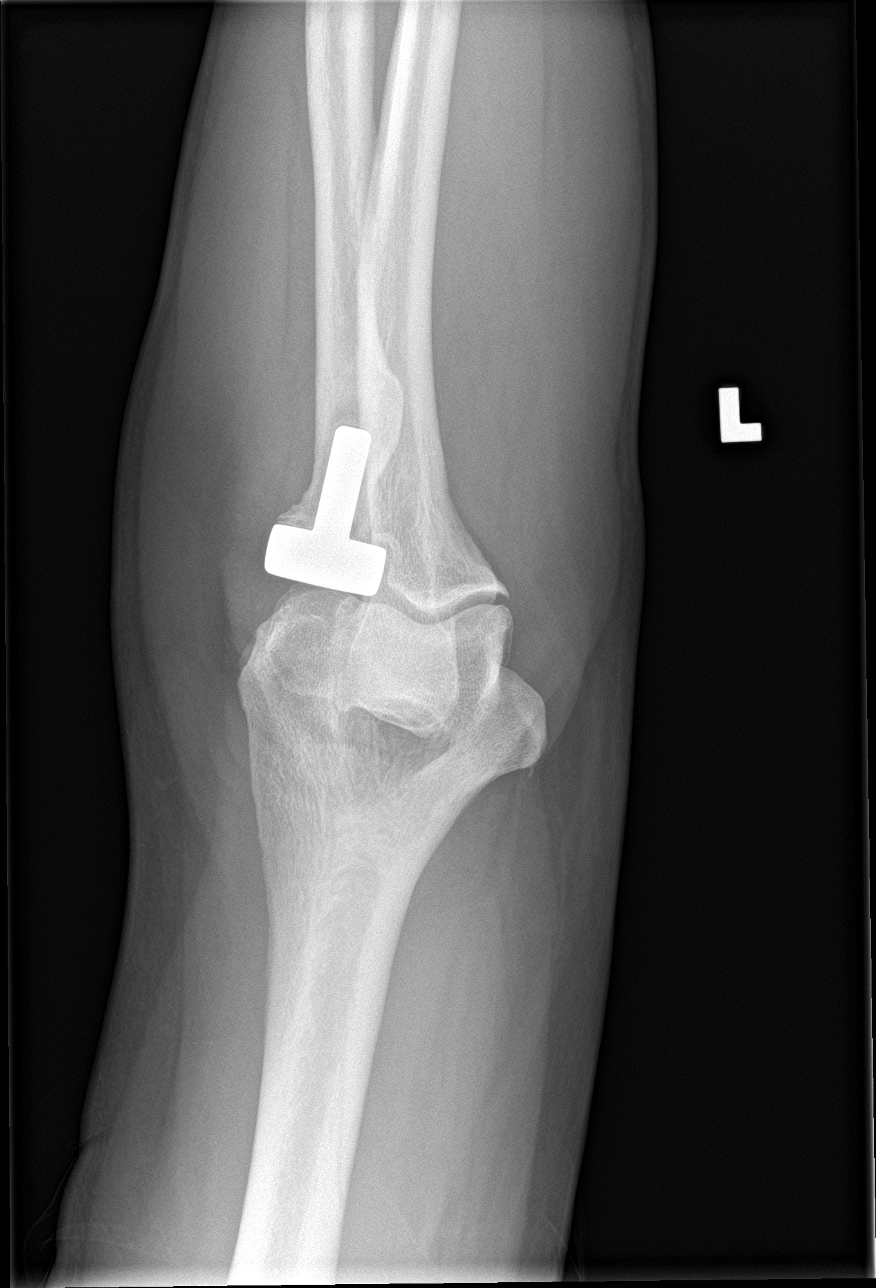

[elbow obl (1 of 2)]
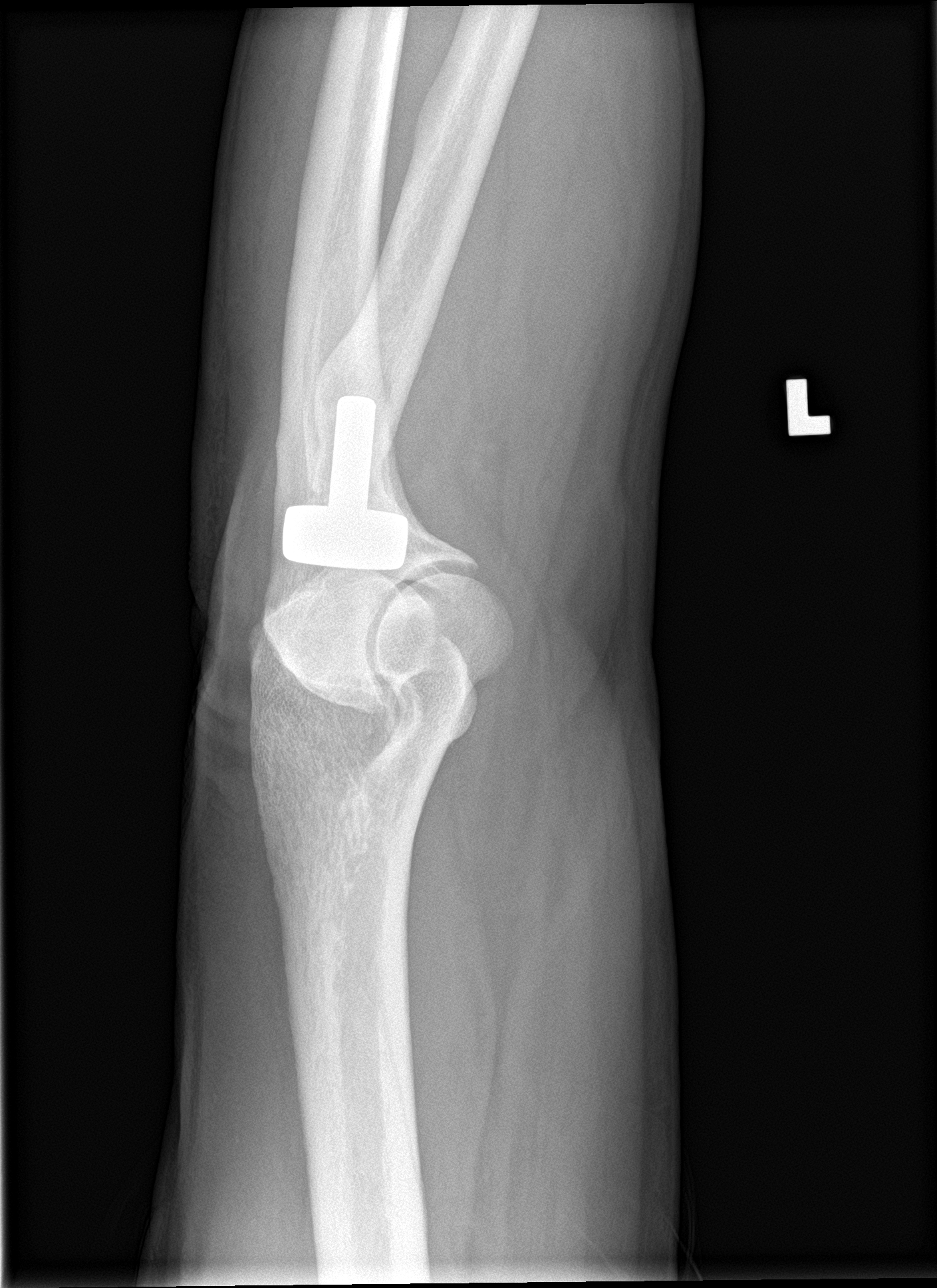

[elbow obl (2 of 2)]
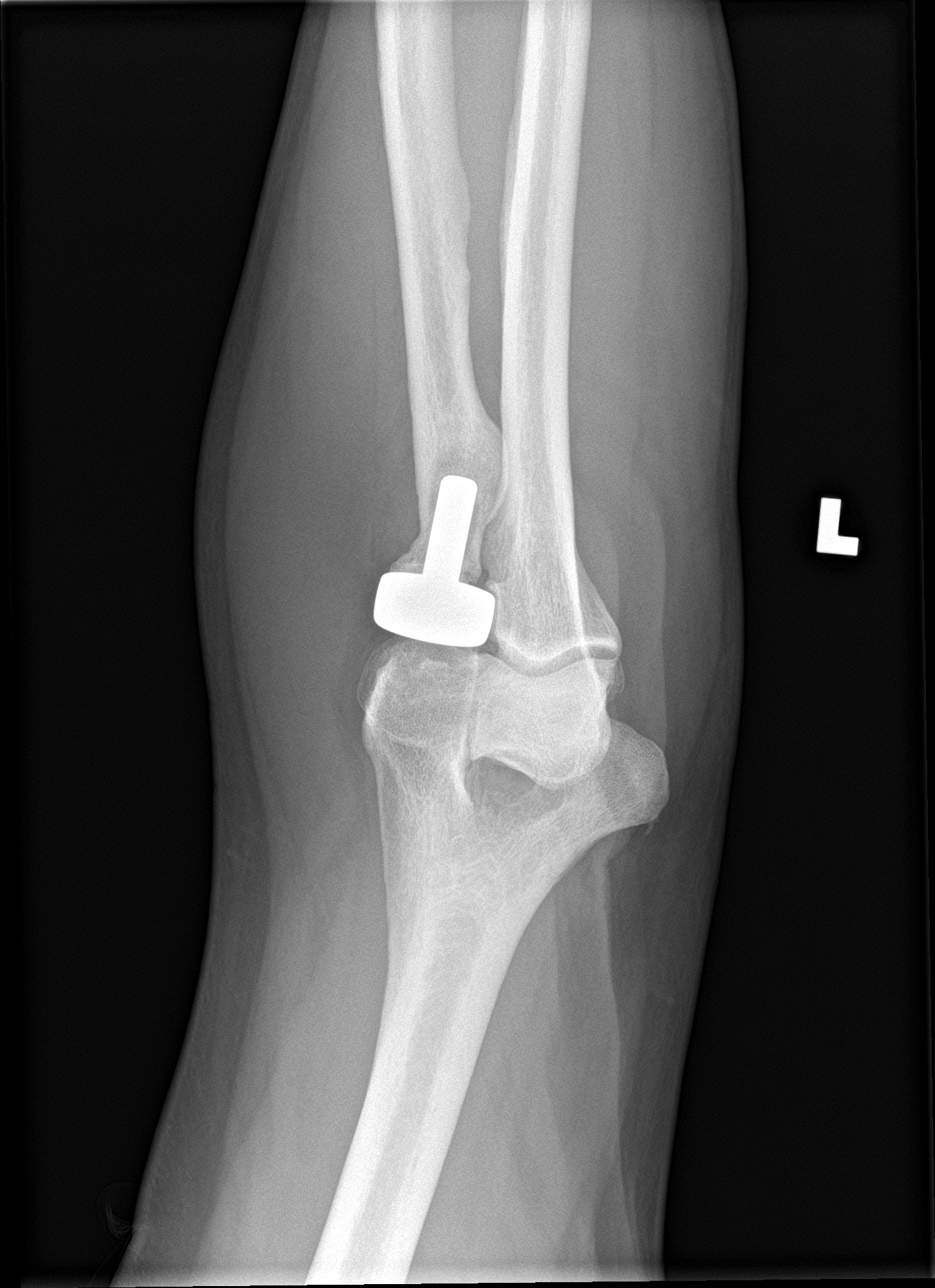

[elbow lat]
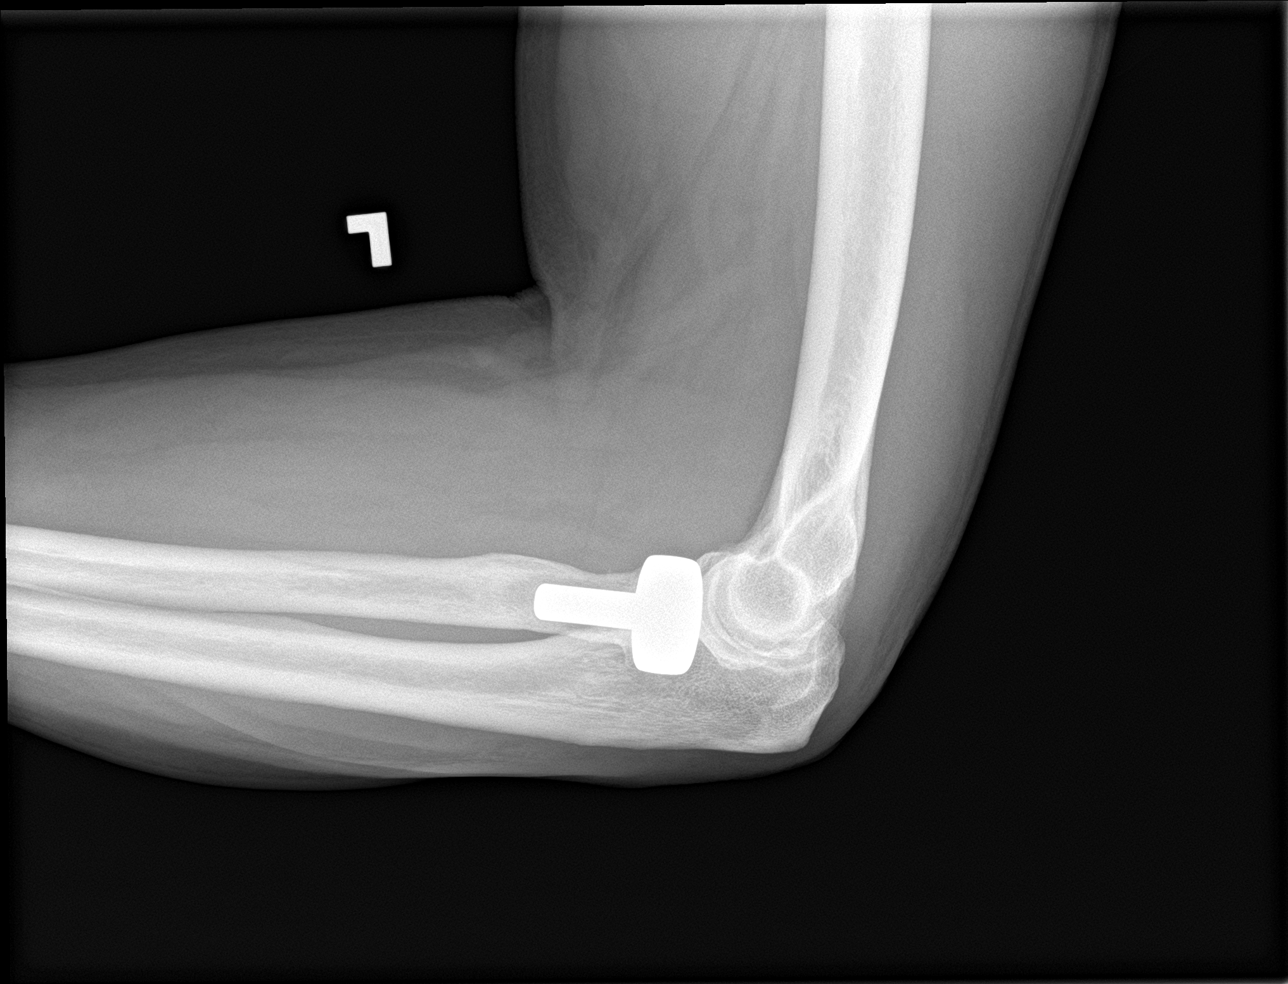

[4 of 4 positions shown; findings below may reference images not displayed]

FINDINGS: Postsurgical changes are again seen of radial head and neck
prosthetic replacement. No perihardware lucency is seen to indicate
hardware failure or loosening.

Unchanged mild joint space narrowing and peripheral spurring of the
medial elbow.

No elbow joint effusion. No acute fracture is seen. No dislocation.
IMPRESSION: No significant change from prior. Status post proximal right radial
arthroplasty without evidence of hardware failure.

## 2023-11-07 SURGERY — SHOULDER ARTHROSCOPY WITH SUBACROMIAL DECOMPRESSION, ROTATOR CUFF REPAIR AND BICEP TENDON REPAIR
Anesthesia: Regional | Site: Shoulder | Laterality: Right

## 2023-11-07 MED ORDER — LIDOCAINE 2% (20 MG/ML) 5 ML SYRINGE
INTRAMUSCULAR | Status: AC
  Filled 2023-11-07: qty 5

## 2023-11-07 MED ORDER — FENTANYL CITRATE (PF) 100 MCG/2ML IJ SOLN
100.0000 ug | Freq: Once | INTRAMUSCULAR | Status: AC
  Administered 2023-11-07: 100 ug via INTRAVENOUS

## 2023-11-07 MED ORDER — FENTANYL CITRATE (PF) 100 MCG/2ML IJ SOLN: 25.0000 ug | INTRAMUSCULAR | Status: DC | PRN

## 2023-11-07 MED ORDER — ROCURONIUM 10MG/ML (10ML) SYRINGE FOR MEDFUSION PUMP - OPTIME
INTRAVENOUS | Status: DC | PRN
  Administered 2023-11-07: 40 mg via INTRAVENOUS

## 2023-11-07 MED ORDER — ROCURONIUM BROMIDE 10 MG/ML (PF) SYRINGE
PREFILLED_SYRINGE | INTRAVENOUS | Status: AC
  Filled 2023-11-07: qty 10

## 2023-11-07 MED ORDER — ACETAMINOPHEN 500 MG PO TABS
ORAL_TABLET | ORAL | Status: AC
  Filled 2023-11-07: qty 2

## 2023-11-07 MED ORDER — PROPOFOL 500 MG/50ML IV EMUL
INTRAVENOUS | Status: DC | PRN
  Administered 2023-11-07: 200 mg via INTRAVENOUS

## 2023-11-07 MED ORDER — ACETAMINOPHEN 500 MG PO TABS: 1000.0000 mg | ORAL_TABLET | Freq: Three times a day (TID) | ORAL | 0 refills | Status: AC

## 2023-11-07 MED ORDER — LIDOCAINE 2% (20 MG/ML) 5 ML SYRINGE
INTRAMUSCULAR | Status: DC | PRN
  Administered 2023-11-07: 40 mg via INTRAVENOUS

## 2023-11-07 MED ORDER — AMISULPRIDE (ANTIEMETIC) 5 MG/2ML IV SOLN: 10.0000 mg | Freq: Once | INTRAVENOUS | Status: DC | PRN

## 2023-11-07 MED ORDER — LACTATED RINGERS IV SOLN: INTRAVENOUS | Status: DC

## 2023-11-07 MED ORDER — DEXAMETHASONE SODIUM PHOSPHATE 10 MG/ML IJ SOLN
INTRAMUSCULAR | Status: AC
Start: 2023-11-07 — End: ?
  Filled 2023-11-07: qty 1

## 2023-11-07 MED ORDER — MIDAZOLAM HCL 2 MG/2ML IJ SOLN
INTRAMUSCULAR | Status: AC
  Filled 2023-11-07: qty 2

## 2023-11-07 MED ORDER — ACETAMINOPHEN 500 MG PO TABS
1000.0000 mg | ORAL_TABLET | Freq: Once | ORAL | Status: AC
  Administered 2023-11-07: 1000 mg via ORAL

## 2023-11-07 MED ORDER — CEFAZOLIN SODIUM-DEXTROSE 2-4 GM/100ML-% IV SOLN
2.0000 g | INTRAVENOUS | Status: AC
  Administered 2023-11-07: 2 g via INTRAVENOUS

## 2023-11-07 MED ORDER — GABAPENTIN 300 MG PO CAPS: 300.0000 mg | ORAL_CAPSULE | Freq: Once | ORAL | Status: DC

## 2023-11-07 MED ORDER — BUPIVACAINE LIPOSOME 1.3 % IJ SUSP
INTRAMUSCULAR | Status: DC | PRN
Start: 2023-11-07 — End: 2023-11-07
  Administered 2023-11-07: 10 mL via PERINEURAL

## 2023-11-07 MED ORDER — PHENYLEPHRINE HCL-NACL 20-0.9 MG/250ML-% IV SOLN
INTRAVENOUS | Status: DC | PRN
  Administered 2023-11-07: 160 ug via INTRAVENOUS
  Administered 2023-11-07: 80 ug via INTRAVENOUS
  Administered 2023-11-07: 160 ug via INTRAVENOUS
  Administered 2023-11-07 (×2): 80 ug via INTRAVENOUS
  Administered 2023-11-07: 160 ug via INTRAVENOUS
  Administered 2023-11-07: 50 ug/min via INTRAVENOUS

## 2023-11-07 MED ORDER — ONDANSETRON HCL 4 MG/2ML IJ SOLN: 4.0000 mg | Freq: Once | INTRAMUSCULAR | Status: DC | PRN

## 2023-11-07 MED ORDER — GABAPENTIN 100 MG PO CAPS
100.0000 mg | ORAL_CAPSULE | Freq: Three times a day (TID) | ORAL | 0 refills | Status: AC
Start: 2023-11-07 — End: 2023-11-21

## 2023-11-07 MED ORDER — GABAPENTIN 300 MG PO CAPS
ORAL_CAPSULE | ORAL | Status: AC
  Filled 2023-11-07: qty 1

## 2023-11-07 MED ORDER — MIDAZOLAM HCL 2 MG/2ML IJ SOLN
2.0000 mg | Freq: Once | INTRAMUSCULAR | Status: AC
  Administered 2023-11-07: 2 mg via INTRAVENOUS

## 2023-11-07 MED ORDER — PHENYLEPHRINE 80 MCG/ML (10ML) SYRINGE FOR IV PUSH (FOR BLOOD PRESSURE SUPPORT)
PREFILLED_SYRINGE | INTRAVENOUS | Status: AC
  Filled 2023-11-07: qty 20

## 2023-11-07 MED ORDER — SODIUM CHLORIDE 0.9 % IR SOLN
Status: DC | PRN
  Administered 2023-11-07: 12000 mL

## 2023-11-07 MED ORDER — ONDANSETRON HCL 4 MG/2ML IJ SOLN
INTRAMUSCULAR | Status: DC | PRN
Start: 2023-11-07 — End: 2023-11-07
  Administered 2023-11-07: 4 mg via INTRAVENOUS

## 2023-11-07 MED ORDER — ONDANSETRON HCL 4 MG PO TABS: 4.0000 mg | ORAL_TABLET | Freq: Three times a day (TID) | ORAL | 0 refills | Status: AC | PRN

## 2023-11-07 MED ORDER — OXYCODONE HCL 5 MG PO TABS: ORAL_TABLET | ORAL | 0 refills | Status: AC

## 2023-11-07 MED ORDER — FENTANYL CITRATE (PF) 100 MCG/2ML IJ SOLN
INTRAMUSCULAR | Status: AC
  Filled 2023-11-07: qty 2

## 2023-11-07 MED ORDER — CEFAZOLIN SODIUM-DEXTROSE 2-4 GM/100ML-% IV SOLN
INTRAVENOUS | Status: AC
  Filled 2023-11-07: qty 100

## 2023-11-07 MED ORDER — ONDANSETRON HCL 4 MG/2ML IJ SOLN
INTRAMUSCULAR | Status: AC
  Filled 2023-11-07: qty 2

## 2023-11-07 MED ORDER — KETOROLAC TROMETHAMINE 30 MG/ML IJ SOLN
INTRAMUSCULAR | Status: AC
Start: 2023-11-07 — End: ?
  Filled 2023-11-07: qty 1

## 2023-11-07 MED ORDER — BUPIVACAINE HCL (PF) 0.5 % IJ SOLN
INTRAMUSCULAR | Status: DC | PRN
  Administered 2023-11-07: 14 mL via PERINEURAL

## 2023-11-07 MED ORDER — SUGAMMADEX SODIUM 200 MG/2ML IV SOLN
INTRAVENOUS | Status: DC | PRN
  Administered 2023-11-07: 300 mg via INTRAVENOUS

## 2023-11-07 MED ORDER — DEXAMETHASONE SODIUM PHOSPHATE 10 MG/ML IJ SOLN
INTRAMUSCULAR | Status: DC | PRN
  Administered 2023-11-07: 10 mg via INTRAVENOUS

## 2023-11-07 SURGICAL SUPPLY — 51 items
ANCHOR FIBERTAK RC 2.6 (BLUE) (Anchor) IMPLANT
ANCHOR SUT 1.8 FIBERTAK SB KL (Anchor) IMPLANT
ANCHOR SUT FBRTK 2.6X1.7X2 (Anchor) IMPLANT
ANCHOR SWIVELOCK SP KL 4.75 (Anchor) IMPLANT
BLADE EXCALIBUR 4.0X13 (MISCELLANEOUS) ×1 IMPLANT
BURR OVAL 8 FLU 4.0X13 (MISCELLANEOUS) ×1 IMPLANT
CANNULA 5.75X71 LONG (CANNULA) IMPLANT
CANNULA PASSPORT 5 (CANNULA) IMPLANT
CANNULA PASSPORT BUTTON 10-40 (CANNULA) IMPLANT
CANNULA TWIST IN 8.25X7CM (CANNULA) IMPLANT
CHLORAPREP W/TINT 26 (MISCELLANEOUS) ×1 IMPLANT
CLSR STERI-STRIP ANTIMIC 1/2X4 (GAUZE/BANDAGES/DRESSINGS) ×1 IMPLANT
COOLER ICEMAN CLASSIC (MISCELLANEOUS) ×1 IMPLANT
DRAPE IMP U-DRAPE 54X76 (DRAPES) ×1 IMPLANT
DRAPE INCISE IOBAN 66X45 STRL (DRAPES) IMPLANT
DRAPE SHOULDER BEACH CHAIR (DRAPES) ×1 IMPLANT
DW OUTFLOW CASSETTE/TUBE SET (MISCELLANEOUS) ×1 IMPLANT
GAUZE PAD ABD 8X10 STRL (GAUZE/BANDAGES/DRESSINGS) ×1 IMPLANT
GAUZE SPONGE 4X4 12PLY STRL (GAUZE/BANDAGES/DRESSINGS) ×1 IMPLANT
GLOVE BIO SURGEON STRL SZ 6.5 (GLOVE) ×1 IMPLANT
GLOVE BIOGEL PI IND STRL 6.5 (GLOVE) ×1 IMPLANT
GLOVE BIOGEL PI IND STRL 8 (GLOVE) ×1 IMPLANT
GLOVE ECLIPSE 8.0 STRL XLNG CF (GLOVE) ×1 IMPLANT
GOWN STRL REUS W/ TWL LRG LVL3 (GOWN DISPOSABLE) ×2 IMPLANT
GOWN STRL REUS W/TWL XL LVL3 (GOWN DISPOSABLE) ×1 IMPLANT
KIT STABILIZATION SHOULDER (MISCELLANEOUS) ×1 IMPLANT
KIT STR SPEAR 1.8 FBRTK DISP (KITS) IMPLANT
LASSO CRESCENT QUICKPASS (SUTURE) IMPLANT
MANIFOLD NEPTUNE II (INSTRUMENTS) ×1 IMPLANT
NDL HD SCORPION MEGA LOADER (NEEDLE) IMPLANT
NDL SAFETY ECLIPSE 18X1.5 (NEEDLE) ×1 IMPLANT
PACK ARTHROSCOPY DSU (CUSTOM PROCEDURE TRAY) ×1 IMPLANT
PACK BASIN DAY SURGERY FS (CUSTOM PROCEDURE TRAY) ×1 IMPLANT
PAD COLD SHLDR WRAP-ON (PAD) ×1 IMPLANT
RESTRAINT HEAD UNIVERSAL NS (MISCELLANEOUS) ×1 IMPLANT
SHEET MEDIUM DRAPE 40X70 STRL (DRAPES) ×1 IMPLANT
SLEEVE SCD COMPRESS KNEE MED (STOCKING) ×1 IMPLANT
SLING ARM FOAM STRAP LRG (SOFTGOODS) IMPLANT
SUT FIBERWIRE #2 38 T-5 BLUE (SUTURE) IMPLANT
SUT MNCRL AB 4-0 PS2 18 (SUTURE) ×1 IMPLANT
SUT PDS AB 0 CT 36 (SUTURE) IMPLANT
SUT TIGER TAPE 7 IN WHITE (SUTURE) IMPLANT
SUTURE FIBERWR #2 38 T-5 BLUE (SUTURE) IMPLANT
SUTURE TAPE TIGERLINK 1.3MM BL (SUTURE) IMPLANT
SUTURETAPE TIGERLINK 1.3MM BL (SUTURE) ×1 IMPLANT
SYR 5ML LL (SYRINGE) ×1 IMPLANT
TAPE FIBER 2MM 7IN #2 BLUE (SUTURE) IMPLANT
TOWEL GREEN STERILE FF (TOWEL DISPOSABLE) ×2 IMPLANT
TUBE CONNECTING 20X1/4 (TUBING) ×1 IMPLANT
TUBING ARTHROSCOPY IRRIG 16FT (MISCELLANEOUS) ×1 IMPLANT
WAND ABLATOR APOLLO I90 (BUR) ×1 IMPLANT

## 2023-11-07 NOTE — Anesthesia Postprocedure Evaluation (Signed)
Anesthesia Post Note  Patient: Oscar Gallagher  Procedure(s) Performed: SHOULDER ARTHROSCOPY WITH SUBACROMIAL DECOMPRESSION, ROTATOR CUFF REPAIR AND BICEP TENDON REPAIR (Right: Shoulder) SHOULDER ARTHROSCOPY WITH DISTAL CLAVICLE EXCISION (Right: Shoulder)     Patient location during evaluation: PACU Anesthesia Type: Regional and General Level of consciousness: awake Pain management: pain level controlled Vital Signs Assessment: post-procedure vital signs reviewed and stable Respiratory status: spontaneous breathing, nonlabored ventilation and respiratory function stable Cardiovascular status: blood pressure returned to baseline and stable Postop Assessment: no apparent nausea or vomiting Anesthetic complications: no   No notable events documented.  Last Vitals:  Vitals:   11/07/23 1245 11/07/23 1309  BP: (!) 141/80 (!) 152/89  Pulse: 95 91  Resp: 18 16  Temp:  36.8 C  SpO2: 92% 93%    Last Pain:  Vitals:   11/07/23 1309  TempSrc:   PainSc: 0-No pain                 Florence Antonelli P Janai Maudlin

## 2023-11-07 NOTE — Interval H&P Note (Signed)
All questions answered, patient wants to proceed with procedure. ? ?

## 2023-11-07 NOTE — Anesthesia Procedure Notes (Signed)
Procedure Name: Intubation Date/Time: 11/07/2023 11:09 AM  Performed by: Yolanda Bonine, CRNAPre-anesthesia Checklist: Patient identified, Emergency Drugs available, Suction available and Patient being monitored Patient Re-evaluated:Patient Re-evaluated prior to induction Oxygen Delivery Method: Circle system utilized Preoxygenation: Pre-oxygenation with 100% oxygen Induction Type: IV induction Ventilation: Mask ventilation without difficulty Laryngoscope Size: Mac and 3 Grade View: Grade II Tube type: Oral Number of attempts: 1 Airway Equipment and Method: Stylet Placement Confirmation: ETT inserted through vocal cords under direct vision, positive ETCO2 and breath sounds checked- equal and bilateral Secured at: 22 cm Tube secured with: Tape Dental Injury: Teeth and Oropharynx as per pre-operative assessment

## 2023-11-07 NOTE — Op Note (Signed)
 Orthopaedic Surgery Operative Note (CSN: [id number])  Oscar Gallagher  03-Apr-1956 Date of Surgery: 11/07/2023   DIAGNOSES: Right shoulder, acute on chronic rotator cuff tear, SLAP tear, biceps tendinitis, AC arthritis, and subacromial impingement.  POST-OPERATIVE DIAGNOSIS: same  PROCEDURE: Arthroscopic extensive debridement - 29823 Subdeltoid Bursa, Supraspinatus Tendon, Anterior Labrum, Superior Labrum, Posterior Labrum, and glenoid bone, glenoid cartilage, humeral bone and humeral cartilage Arthroscopic distal clavicle excision - 40981 Arthroscopic subacromial decompression - 19147 Arthroscopic rotator cuff repair - 82956 Arthroscopic biceps tenodesis - 21308   OPERATIVE FINDING: Exam under anesthesia: Normal Articular space:  Significant capsulitis of the anterior interval as well as thickening of the [facility] Chondral surfaces: Normal Biceps:  2 SLAP tear Subscapularis: Incomplete tear small partial-thickness upper border 5% tear, did not require repair and was debrided alone. Supraspinatus: Complete tear posterior aspect of supraspinatus Infraspinatus: Complete tear complete infraspinatus   This was a more posterior tear than is typical.  We had a complete tear of the infraspinatus and the posterior aspect of the supraspinatus.  We had a 2 x 2 repair with good fixation.  There is significant capsulitis and I worry about stiffness for this patient.  Post-operative plan: The patient will be non-weightbearing in a sling for 6 weeks.  The patient will be discharged home.  DVT prophylaxis not indicated in ambulatory upper extremity patient without known risk factors.   Pain control with PRN pain medication preferring oral medicines.  Follow up plan will be scheduled in approximately 7 days for incision check and XR.  Surgeons:Primary: Bjorn Pippin, MD Assistants: Darron Doom, RNFA, Alfonse Alpers, PA-C Location[facility] OR ROOM 1 Anesthesia: General with Exparel interscalene  block Antibiotics: Ancef 2 g Tourniquet time: None Estimated Blood Loss: Minimal Complications: None Specimens: None Implants: Implant Name Type Inv. Item Serial No. Manufacturer Lot No. LRB No. Used Action  ANCHOR SUT 1.8 FIBERTAK SB KL - [id number] Anchor ANCHOR SUT 1.8 FIBERTAK SB KL  [employer] [id number] Right 1 Implanted  ANCHOR SUT 1.8 FIBERTAK SB KL - [id number] Anchor ANCHOR SUT 1.8 FIBERTAK SB KL  [employer] [id number] Right 1 Implanted  ANCHOR SUT FBRTK 2.6X1.7X2 - [id number] Anchor ANCHOR SUT FBRTK 2.6X1.7X2  [employer] [id number] Right 1 Implanted  ANCHOR FIBERTAK RC 2.6 [facility]) - [id number] Anchor ANCHOR FIBERTAK RC 2.6 [facility])  [employer] [id number] Right 1 Implanted  [facility] SWIVELOCK SP KL 4.75 - [id number] Anchor ANCHOR SWIVELOCK SP KL 4.75  [employer] [id number] Right 1 Implanted  [facility] SWIVELOCK SP KL 4.75 - [id number] Anchor ANCHOR SWIVELOCK SP KL 4.75  [employer] [id number] Right 1 Implanted    Indications for Surgery:   Oscar Gallagher is a 68 y.o. male with continued shoulder pain refractory to nonoperative measures for extended period of time.    The risks and benefits were explained at length including but not limited to continued pain, cuff failure, biceps tenodesis failure, stiffness, need for further surgery and infection.   Procedure:   Patient was correctly identified in the preoperative holding area and operative site marked.  Patient brought to OR and positioned beachchair on an Lannon table ensuring that all bony prominences were padded and the head was in an appropriate location.  Anesthesia was induced and the operative shoulder was prepped and draped in the usual sterile fashion.  Timeout was called preincision.  A standard posterior viewing portal was made after localizing the portal with a spinal needle.  An anterior accessory portal was also made.  After clearing the articular  space the camera was positioned in the subacromial space.  Findings above.     Extensive debridement was performed of the anterior interval tissue, labral fraying and the bursa.  Glenoid bone, glenoid cartilage, humeral bone were all debrided.  Subacromial decompression: We made a lateral portal with spinal needle guidance. We then proceeded to debride bursal tissue extensively with a shaver and arthrocare device. At that point we continued to identify the borders of the acromion and identify the spur. We then carefully preserved the deltoid fascia and used a burr to convert the acromion to a Type 1 flat acromion without issue.  Arthroscopic rotator cuff repair: Once the above is complete we used a shaver as well as a bur to prepare the tuberosity and clear soft tissue so that there was bony bleeding and appropriate bloody flecks for healing.  We then placed 2 Medial Row 2.6 mm fiber tack anchors with knotless mechanisms and tape.  We used a scorpion as well as a link suture to pass all of the sutures from each anchor through the cuff.  We then were able to use the knotless mechanism sutures to perform a double medial row tiedown compressing the medial row without overtensioning. Once this was complete we cut the switch sutures and performed a speed bridge type repair pulling the tapes over to 2 lateral row 4.75 mm bio composite swivel locks.  This provided compression of the cuff to the tuberosity.  Biceps tenodesis: We marked the tendon and then performed a tenotomy and debridement of the stump in the articular space. We then identified the biceps tendon in its groove suprapec with the arthroscope in the lateral portal taking care to move from lateral to medial to avoid injury to the subscapularis. At that point we unroofed the tendon itself and mobilized it. An accessory anterior portal was made in line with the tendon and we grasped it from the anterior superior portal and worked from the accessory anterior portal. Two Fibertak 1.30mm knotless anchors were placed in the groove and  the tendon was secured in a luggage loop style fashion with a pass of the limb of suture through the tendon using a scorpion device to avoid pull-through.  Repair was completed with good tension on the tendon.  Residual stump of the tendon was removed after being resected with a RF ablator.  Distal Clavicle resection:  The scope was placed in the subacromial space from the posterior portal.  A hemostat was placed through the anterior portal and we spread at the [facility] joint.  A burr was then inserted and 10 mm of distal clavicle was resected taking care to avoid damage to the capsule around the joint and avoiding overhanging bone posteriorly.    The incisions were closed with absorbable monocryl and steri strips.  A sterile dressing was placed along with a sling. The patient was awoken from general anesthesia and taken to the PACU in stable condition without complication.   Alfonse Alpers, PA-C, present throughout the case, critical for completion in a timely fashion, and for retraction, instrumentation, closure.

## 2023-11-07 NOTE — Anesthesia Preprocedure Evaluation (Addendum)
Anesthesia Evaluation  Patient identified by MRN, date of birth, ID band Patient awake    Reviewed: Allergy & Precautions, NPO status , Patient's Chart, lab work & pertinent test results  Airway Mallampati: II  TM Distance: >3 FB Neck ROM: Full    Dental no notable dental hx.    Pulmonary neg pulmonary ROS   Pulmonary exam normal        Cardiovascular hypertension, Pt. on medications Normal cardiovascular exam     Neuro/Psych  Neuromuscular disease  negative psych ROS   GI/Hepatic Neg liver ROS,GERD  Medicated and Controlled,,  Endo/Other  diabetesHypothyroidism  Patient on GLP-1 Agonist  Renal/GU negative Renal ROS     Musculoskeletal  (+) Arthritis ,    Abdominal  (+) + obese  Peds  Hematology negative hematology ROS (+)   Anesthesia Other Findings right shoulder cartilage disorder, OA, impingement syndrome,bicep tendinitis, rotator cuff tear  Reproductive/Obstetrics                             Anesthesia Physical Anesthesia Plan  ASA: 3  Anesthesia Plan: General and Regional   Post-op Pain Management: Regional block*   Induction: Intravenous  PONV Risk Score and Plan: 2 and Ondansetron, Dexamethasone, Midazolam and Treatment may vary due to age or medical condition  Airway Management Planned: Oral ETT  Additional Equipment:   Intra-op Plan:   Post-operative Plan: Extubation in OR  Informed Consent: I have reviewed the patients History and Physical, chart, labs and discussed the procedure including the risks, benefits and alternatives for the proposed anesthesia with the patient or authorized representative who has indicated his/her understanding and acceptance.     Dental advisory given  Plan Discussed with: CRNA  Anesthesia Plan Comments:        Anesthesia Quick Evaluation

## 2023-11-07 NOTE — Anesthesia Procedure Notes (Signed)
Anesthesia Regional Block: Interscalene brachial plexus block   Pre-Anesthetic Checklist: , timeout performed,  Correct Patient, Correct Site, Correct Laterality,  Correct Procedure, Correct Position, site marked,  Risks and benefits discussed,  Surgical consent,  Pre-op evaluation,  At surgeon's request and post-op pain management  Laterality: Right  Prep: chloraprep       Needles:  Injection technique: Single-shot  Needle Type: Echogenic Stimulator Needle     Needle Length: 9cm  Needle Gauge: 21     Additional Needles:   Procedures:,,,, ultrasound used (permanent image in chart),,    Narrative:  Start time: 11/07/2023 10:10 AM End time: 11/07/2023 10:20 AM Injection made incrementally with aspirations every 5 mL.  Performed by: Personally  Anesthesiologist: Leonides Grills, MD  Additional Notes: Functioning IV was confirmed and monitors were applied.  A timeout was performed. Sterile prep, hand hygiene and sterile gloves were used. A 90mm 21ga Arrow echogenic stimulator needle was used. Negative aspiration and negative test dose prior to incremental administration of local anesthetic. The patient tolerated the procedure well.  Ultrasound guidance: relevent anatomy identified, needle position confirmed, local anesthetic spread visualized around nerve(s), vascular puncture avoided.  Image printed for medical record.

## 2023-11-07 NOTE — Progress Notes (Signed)
Assisted Dr. Bradley Ferris with right, supraclavicular, ultrasound guided block. Side rails up, monitors on throughout procedure. See vital signs in flow sheet. Tolerated Procedure well.

## 2023-11-07 NOTE — Transfer of Care (Signed)
Immediate Anesthesia Transfer of Care Note  Patient: Oscar Gallagher  Procedure(s) Performed: SHOULDER ARTHROSCOPY WITH SUBACROMIAL DECOMPRESSION, ROTATOR CUFF REPAIR AND BICEP TENDON REPAIR (Right: Shoulder) SHOULDER ARTHROSCOPY WITH DISTAL CLAVICLE EXCISION (Right: Shoulder)  Patient Location: PACU  Anesthesia Type:General and Regional  Level of Consciousness: awake, alert , and patient cooperative  Airway & Oxygen Therapy: Patient Spontanous Breathing and Patient connected to face mask oxygen  Post-op Assessment: Report given to RN and Post -op Vital signs reviewed and stable  Post vital signs: Reviewed and stable  Last Vitals:  Vitals Value Taken Time  BP    Temp    Pulse    Resp    SpO2      Last Pain:  Vitals:   11/07/23 0937  TempSrc: Oral  PainSc: 2       Patients Stated Pain Goal: 9 (11/07/23 0937)  Complications: No notable events documented.

## 2023-11-07 NOTE — Discharge Instructions (Addendum)
Ramond Marrow MD, MPH Alfonse Alpers, PA-C [facility] [address], [address] [phone] (tel)   [phone] (fax)   POST-OPERATIVE INSTRUCTIONS - SHOULDER ARTHROSCOPY  WOUND CARE You may remove the Operative Dressing on Post-Op Day #3 (72hrs after surgery).   Alternatively if you would like you can leave dressing on until follow-up if within 7-8 days but keep it dry. Leave steri-strips in place until they fall off on their own, usually 2 weeks postop. There may be a small amount of fluid/bleeding leaking at the surgical site.  This is normal; the shoulder is filled with fluid during the procedure and can leak for 24-48hrs after surgery.  You may change/reinforce the bandage as needed.  Use the Cryocuff or Ice as often as possible for the first 7 days, then as needed for pain relief. Always keep a towel, ACE wrap or other barrier between the cooling unit and your skin.  You may shower on Post-Op Day #3. Gently pat the area dry.  Do not soak the shoulder in water or submerge it.  Keep incisions as dry as possible. Do not go swimming in the pool or ocean until 4 weeks after surgery or when otherwise instructed.    EXERCISES Wear the sling at all times  You may remove the sling for showering, but keep the arm across the chest or in a secondary sling.     It is normal for your fingers/hand to become more swollen after surgery and discolored from bruising.   This will resolve over the first few weeks usually after surgery. Please continue to ambulate and do not stay sitting or lying for too long.  Perform foot and wrist pumps to assist in circulation.  PHYSICAL THERAPY - You will begin physical therapy soon after surgery (unless otherwise specified) - Please call to set up an appointment, if you do not already have one  - Let our office if there are any issues with scheduling your therapy  - A PT referral was sent to [facility] in  [place]   REGIONAL ANESTHESIA (NERVE BLOCKS) The anesthesia team may have performed a nerve block for you this is a great tool used to minimize pain.   The block may start wearing off overnight (between 8-24 hours postop) When the block wears off, your pain may go from nearly zero to the pain you would have had postop without the block. This is an abrupt transition but nothing dangerous is happening.   This can be a challenging period but utilize your as needed pain medications to try and manage this period. We suggest you use the pain medication the first night prior to going to bed, to ease this transition.  You may take an extra dose of narcotic when this happens if needed  POST-OP MEDICATIONS- Multimodal approach to pain control In general your pain will be controlled with a combination of substances.  Prescriptions unless otherwise discussed are electronically sent to your pharmacy.  This is a carefully made plan we use to minimize narcotic use.     Gabapentin - this is to help with nerve based pain, taken on a scheduled basis Acetaminophen - Non-narcotic pain medicine taken on a scheduled basis  Oxycodone - This is a strong narcotic, to be used only on an "as needed" basis for SEVERE pain. Zofran - take as needed for nausea   FOLLOW-UP If you develop a Fever (>=101.5), Redness or Drainage from the surgical incision site, please call our  office to arrange for an evaluation. Please call the office to schedule a follow-up appointment for your first post-operative appointment, 7-10 days post-operatively.    HELPFUL INFORMATION   You may be more comfortable sleeping in a semi-seated position the first few nights following surgery.  Keep a pillow propped under the elbow and forearm for comfort.  If you have a recliner type of chair it might be beneficial.  If not that is fine too, but it would be helpful to sleep propped up with pillows behind your operated shoulder as well under  your elbow and forearm.  This will reduce pulling on the suture lines.  When dressing, put your operative arm in the sleeve first.  When getting undressed, take your operative arm out last.  Loose fitting, button-down shirts are recommended.  Often in the first days after surgery you may be more comfortable keeping your operative arm under your shirt and not through the sleeve.  You may return to work/school in the next couple of days when you feel up to it.  Desk work and typing in the sling is fine.  We suggest you use the pain medication the first night prior to going to bed, in order to ease any pain when the anesthesia wears off. You should avoid taking pain medications on an empty stomach as it will make you nauseous.  You should wean off your narcotic medicines as soon as you are able.  Most patients will be off narcotics before their first postop appointment.   Do not drink alcoholic beverages or take illicit drugs when taking pain medications.  It is against the law to drive while taking narcotics.  In some states it is against the law to drive while your arm is in a sling.   Pain medication may make you constipated.  Below are a few solutions to try in this order: Decrease the amount of pain medication if you aren't having pain. Drink lots of decaffeinated fluids. Drink prune juice and/or eat dried prunes  If the first 3 don't work start with additional solutions Take Colace - an over-the-counter stool softener Take Senokot - an over-the-counter laxative Take Miralax - a stronger over-the-counter laxative  For more information including helpful videos and documents visit our website:   [email or web address]

## 2023-11-08 ENCOUNTER — Encounter (HOSPITAL_BASED_OUTPATIENT_CLINIC_OR_DEPARTMENT_OTHER): Payer: Self-pay | Admitting: Orthopaedic Surgery
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
## 2023-11-13 ENCOUNTER — Other Ambulatory Visit: Payer: Self-pay

## 2023-11-13 ENCOUNTER — Ambulatory Visit: Payer: Medicare (Managed Care) | Attending: Orthopaedic Surgery | Admitting: Physical Therapy

## 2023-11-13 ENCOUNTER — Encounter: Payer: Self-pay | Admitting: Physical Therapy

## 2023-11-13 DIAGNOSIS — M25611 Stiffness of right shoulder, not elsewhere classified: Secondary | ICD-10-CM | POA: Insufficient documentation

## 2023-11-13 DIAGNOSIS — R600 Localized edema: Secondary | ICD-10-CM | POA: Insufficient documentation

## 2023-11-13 DIAGNOSIS — G8929 Other chronic pain: Secondary | ICD-10-CM | POA: Insufficient documentation

## 2023-11-13 DIAGNOSIS — M25511 Pain in right shoulder: Secondary | ICD-10-CM | POA: Diagnosis present

## 2023-11-13 DIAGNOSIS — M6281 Muscle weakness (generalized): Secondary | ICD-10-CM | POA: Diagnosis present

## 2023-11-19 ENCOUNTER — Ambulatory Visit: Payer: Medicare (Managed Care)

## 2023-11-20 ENCOUNTER — Ambulatory Visit: Payer: Medicare (Managed Care)

## 2023-11-20 DIAGNOSIS — R600 Localized edema: Secondary | ICD-10-CM

## 2023-11-20 DIAGNOSIS — M25511 Pain in right shoulder: Secondary | ICD-10-CM | POA: Diagnosis not present

## 2023-11-20 DIAGNOSIS — M6281 Muscle weakness (generalized): Secondary | ICD-10-CM

## 2023-11-20 DIAGNOSIS — M25611 Stiffness of right shoulder, not elsewhere classified: Secondary | ICD-10-CM

## 2023-11-20 DIAGNOSIS — G8929 Other chronic pain: Secondary | ICD-10-CM
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
## 2023-11-25 ENCOUNTER — Other Ambulatory Visit: Payer: Self-pay

## 2023-11-25 NOTE — Progress Notes (Signed)
   11/25/2023  Patient ID: Oscar Gallagher, male   DOB: [birth year], 68 y.o.   MRN: [id number]  Patient outreach in response to in basket message from patient requesting telephone call this afternoon.  Patient inquiring about financial assistance to help with cost of Orthovisc injections for his wife.  Informed him that I do not see anything available, but I suggest contacting the prescriber's office (not [facility] provider) to see if they are aware of programs.  Telephone follow-up visit with Oscar Gallagher to check on management of T2DM is scheduled for Thursday.  Lenna Gilford, PharmD, DPLA

## 2023-11-27 ENCOUNTER — Ambulatory Visit: Payer: Medicare (Managed Care) | Attending: Orthopaedic Surgery

## 2023-11-27 DIAGNOSIS — R600 Localized edema: Secondary | ICD-10-CM | POA: Insufficient documentation

## 2023-11-27 DIAGNOSIS — M6281 Muscle weakness (generalized): Secondary | ICD-10-CM | POA: Insufficient documentation

## 2023-11-27 DIAGNOSIS — M25611 Stiffness of right shoulder, not elsewhere classified: Secondary | ICD-10-CM | POA: Insufficient documentation

## 2023-11-27 DIAGNOSIS — M25511 Pain in right shoulder: Secondary | ICD-10-CM | POA: Insufficient documentation

## 2023-11-27 DIAGNOSIS — G8929 Other chronic pain: Secondary | ICD-10-CM | POA: Diagnosis not present

## 2023-11-27 DIAGNOSIS — R293 Abnormal posture: Secondary | ICD-10-CM | POA: Diagnosis not present
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
## 2023-11-28 ENCOUNTER — Other Ambulatory Visit: Payer: Self-pay

## 2023-11-28 NOTE — Progress Notes (Signed)
   11/28/2023  Patient ID: Oscar Gallagher, male   DOB: January 03, 1956, 68 y.o.   MRN: [id number]  Subjective/objective Telephone visit to follow-up on management of type 2 diabetes  Diabetes management plan -Current medications: Rybelsus 14 mg daily, Xigduo XR 01/999 mg 1 tablet twice daily -Patient had recently switched from [place] to [place] 10 mg daily due to diarrhea caused by metformin component of this medication.  Based on diarrhea, patient was only taking 1 tablet daily of Xigduo XR 01/999 mg.  Patient states that plain Farxiga 10 mg caused constipation, so he switched back to taking Xigduo XR 01/999 mg twice daily-he still has supply of this on hand left over from what he previously received from the patient assistance program. -Patient endorses decreased occurrence of diarrhea with retrial of Xigduo and he states blood glucose readings are improved versus when taking plain [place] -Patient is tolerating Rybelsus 14 mg daily well but is inquiring about dose increase or more effective medication-states he used Ozempic in the past and did not tolerate well -Patient receives Rybelsus 14 mg through [employer] patient assistance program and is down to a 4-week supply -Patient is using Dexcom for CGM.  He endorses FBG 111-126, and 2-hour postprandial BG 140-150 -Does not endorse any signs or symptoms of hypo or hyperglycemia  Assessment/plan  Diabetes management plan -Continue Rybelsus 14 mg daily -Continue using Xigduo XR 01/999 mg twice daily-pending updated order to replace Farxiga 10 mg with [place] and [place] patient assistance program -I will contact [employer] patient assistance program to check on estimated delivery of Rybelsus 14 mg to make sure patient does not go without -Patient sees primary care provider again 4/14 and will be due for A1c  Follow up:  4/30  Lenna Gilford, PharmD, DPLA

## 2023-11-29 ENCOUNTER — Other Ambulatory Visit: Payer: Self-pay | Admitting: Family Medicine

## 2023-11-29 MED ORDER — XIGDUO XR 5-1000 MG PO TB24: 1.0000 | ORAL_TABLET | Freq: Two times a day (BID) | ORAL | 3 refills | Status: DC

## 2023-12-03 ENCOUNTER — Telehealth: Payer: Self-pay

## 2023-12-03 NOTE — Telephone Encounter (Signed)
 Forwarding to [place] as an Financial planner.  Oscar Gallagher  [employer] PAP shipment for Rybelsus 14 mg dose / 4 boxes received this morning. Please contact the patient to come and pick up their order today. Placed in the front office cabinet with patient identifier. Thanks in advance.   NDC: [id number] LOT: [id number] EXP: 2024-11-21

## 2023-12-04 ENCOUNTER — Other Ambulatory Visit: Payer: Self-pay

## 2023-12-04 ENCOUNTER — Ambulatory Visit: Payer: Medicare (Managed Care) | Admitting: Physical Therapy

## 2023-12-04 ENCOUNTER — Encounter: Payer: Self-pay | Admitting: Physical Therapy

## 2023-12-04 DIAGNOSIS — M25511 Pain in right shoulder: Secondary | ICD-10-CM

## 2023-12-04 DIAGNOSIS — R600 Localized edema: Secondary | ICD-10-CM

## 2023-12-04 DIAGNOSIS — R110 Nausea: Secondary | ICD-10-CM

## 2023-12-04 DIAGNOSIS — M25611 Stiffness of right shoulder, not elsewhere classified: Secondary | ICD-10-CM

## 2023-12-04 DIAGNOSIS — M6281 Muscle weakness (generalized): Secondary | ICD-10-CM

## 2023-12-04 MED ORDER — ONDANSETRON 4 MG PO TBDP: 4.0000 mg | ORAL_TABLET | Freq: Three times a day (TID) | ORAL | 0 refills | Status: AC | PRN

## 2023-12-04 NOTE — Telephone Encounter (Signed)
 Pt has been advised of Rybelsus arrival.
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
## 2023-12-04 NOTE — Telephone Encounter (Signed)
 Pt requesting continuation of Zofran while taking Rybelsus due to nausea.

## 2023-12-11 ENCOUNTER — Ambulatory Visit: Payer: Medicare (Managed Care)

## 2023-12-11 DIAGNOSIS — M25511 Pain in right shoulder: Secondary | ICD-10-CM

## 2023-12-11 DIAGNOSIS — M6281 Muscle weakness (generalized): Secondary | ICD-10-CM

## 2023-12-11 DIAGNOSIS — R600 Localized edema: Secondary | ICD-10-CM

## 2023-12-11 DIAGNOSIS — G8929 Other chronic pain: Secondary | ICD-10-CM

## 2023-12-11 DIAGNOSIS — M25611 Stiffness of right shoulder, not elsewhere classified: Secondary | ICD-10-CM

## 2023-12-11 DIAGNOSIS — R293 Abnormal posture: Secondary | ICD-10-CM
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
## 2023-12-18 ENCOUNTER — Ambulatory Visit: Payer: Medicare (Managed Care)

## 2023-12-18 DIAGNOSIS — M6281 Muscle weakness (generalized): Secondary | ICD-10-CM

## 2023-12-18 DIAGNOSIS — R600 Localized edema: Secondary | ICD-10-CM

## 2023-12-18 DIAGNOSIS — M25611 Stiffness of right shoulder, not elsewhere classified: Secondary | ICD-10-CM

## 2023-12-18 DIAGNOSIS — M25511 Pain in right shoulder: Secondary | ICD-10-CM
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
## 2023-12-23 ENCOUNTER — Other Ambulatory Visit: Payer: Self-pay | Admitting: Family Medicine

## 2023-12-23 DIAGNOSIS — E1169 Type 2 diabetes mellitus with other specified complication: Secondary | ICD-10-CM

## 2023-12-24 DIAGNOSIS — M19011 Primary osteoarthritis, right shoulder: Secondary | ICD-10-CM | POA: Diagnosis not present

## 2023-12-25 ENCOUNTER — Ambulatory Visit: Payer: Medicare (Managed Care) | Attending: Orthopaedic Surgery

## 2023-12-25 DIAGNOSIS — M25611 Stiffness of right shoulder, not elsewhere classified: Secondary | ICD-10-CM | POA: Diagnosis not present

## 2023-12-25 DIAGNOSIS — R600 Localized edema: Secondary | ICD-10-CM | POA: Insufficient documentation

## 2023-12-25 DIAGNOSIS — M25511 Pain in right shoulder: Secondary | ICD-10-CM | POA: Insufficient documentation

## 2023-12-25 DIAGNOSIS — M6281 Muscle weakness (generalized): Secondary | ICD-10-CM | POA: Diagnosis not present
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
## 2024-01-01 ENCOUNTER — Ambulatory Visit: Payer: Medicare (Managed Care)

## 2024-01-01 DIAGNOSIS — R600 Localized edema: Secondary | ICD-10-CM

## 2024-01-01 DIAGNOSIS — M25611 Stiffness of right shoulder, not elsewhere classified: Secondary | ICD-10-CM

## 2024-01-01 DIAGNOSIS — M25511 Pain in right shoulder: Secondary | ICD-10-CM | POA: Diagnosis not present

## 2024-01-01 DIAGNOSIS — M6281 Muscle weakness (generalized): Secondary | ICD-10-CM
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
## 2024-01-06 ENCOUNTER — Ambulatory Visit (INDEPENDENT_AMBULATORY_CARE_PROVIDER_SITE_OTHER): Payer: Medicare (Managed Care) | Admitting: Family Medicine

## 2024-01-06 ENCOUNTER — Encounter: Payer: Self-pay | Admitting: Family Medicine

## 2024-01-06 VITALS — BP 131/77 | HR 73 | Ht 70.0 in | Wt 213.0 lb

## 2024-01-06 DIAGNOSIS — Z125 Encounter for screening for malignant neoplasm of prostate: Secondary | ICD-10-CM

## 2024-01-06 DIAGNOSIS — E039 Hypothyroidism, unspecified: Secondary | ICD-10-CM | POA: Diagnosis not present

## 2024-01-06 DIAGNOSIS — M75101 Unspecified rotator cuff tear or rupture of right shoulder, not specified as traumatic: Secondary | ICD-10-CM | POA: Diagnosis not present

## 2024-01-06 DIAGNOSIS — Z794 Long term (current) use of insulin: Secondary | ICD-10-CM

## 2024-01-06 DIAGNOSIS — E119 Type 2 diabetes mellitus without complications: Secondary | ICD-10-CM | POA: Diagnosis not present

## 2024-01-06 DIAGNOSIS — E1169 Type 2 diabetes mellitus with other specified complication: Secondary | ICD-10-CM

## 2024-01-06 DIAGNOSIS — E785 Hyperlipidemia, unspecified: Secondary | ICD-10-CM | POA: Diagnosis not present

## 2024-01-06 DIAGNOSIS — I10 Essential (primary) hypertension: Secondary | ICD-10-CM | POA: Diagnosis not present

## 2024-01-06 LAB — POCT GLYCOSYLATED HEMOGLOBIN (HGB A1C): HbA1c, POC (controlled diabetic range): 6.6 % (ref 0.0–7.0)

## 2024-01-06 NOTE — Assessment & Plan Note (Signed)
 Continued pain. Recommend avoiding using naproxen and celebrex together.

## 2024-01-06 NOTE — Assessment & Plan Note (Signed)
Blood pressure mains fairly well-controlled.  Continue current medications for management of hypertension.

## 2024-01-06 NOTE — Assessment & Plan Note (Signed)
Doing well with current strength of levothyroxine.  Update TSH

## 2024-01-06 NOTE — Assessment & Plan Note (Signed)
 Doing well with Crestor, continue current strength. Update lipids

## 2024-01-06 NOTE — Progress Notes (Signed)
 Oscar Gallagher - 68 y.o. male MRN [id number]  Date of birth: August 15, 1956  Subjective Chief Complaint  Patient presents with   Diabetes    HPI Oscar Gallagher is a 68 y.o. male here today for follow up visit.    He reports that he is doing well.   He continues on Rybelsus and xigduo.  He is taking xigudo BID and has reduced rybelsus to 7mg  due to GI side effects.   He is receiving these through patient assistance.  He has not had significant side effects at current strength.  A1c today is 6.6%.  Tolerating crestor well at current strength for management of HLD.    He is doing well with maxzide for BP control. No side effect and has not had symptoms related to HTN.  Denies chest pain, shortness of breath, palpitations, headache or vision changes.   He has been using celebrex and naproxen as well as percocet and tizanidine for shoulder . Managed by orthopedics.   ROS:  A comprehensive ROS was completed and negative except as noted per HPI  Allergies  Allergen Reactions   Aspirin Other (See Comments)    Reports a history of Peptic Ulcer Disease    Past Medical History:  Diagnosis Date   Anemia    Taking Vitamin D   Arthritis    Asthma    Diabetes mellitus without complication (HCC)    GERD (gastroesophageal reflux disease)    Hyperlipidemia    Hypertension    Stroke [facility])    Thyroid disease    Ulcer     Past Surgical History:  Procedure Laterality Date   CARPAL TUNNEL RELEASE Right 02/10/2019   ELBOW FRACTURE SURGERY Left    Prosthesis on radial bone   HERNIA REPAIR     JOINT REPLACEMENT     SHOULDER ARTHROSCOPY WITH DISTAL CLAVICLE RESECTION Right 11/07/2023   Procedure: SHOULDER ARTHROSCOPY WITH DISTAL CLAVICLE EXCISION;  Surgeon: Bjorn Pippin, MD;  Location: [facility];  Service: Orthopedics;  Laterality: Right;   SHOULDER ARTHROSCOPY WITH SUBACROMIAL DECOMPRESSION, ROTATOR CUFF REPAIR AND BICEP TENDON REPAIR Right 11/07/2023   Procedure: SHOULDER  ARTHROSCOPY WITH SUBACROMIAL DECOMPRESSION, ROTATOR CUFF REPAIR AND BICEP TENDON REPAIR;  Surgeon: Bjorn Pippin, MD;  Location: [facility];  Service: Orthopedics;  Laterality: Right;    Social History   Socioeconomic History   Marital status: Married    Spouse name: Oscar Gallagher   Number of children: 1   Years of education: 12   Highest education level: Some college, no degree  Occupational History   Occupation: Retired  Tobacco Use   Smoking status: Never   Smokeless tobacco: Never  Vaping Use   Vaping status: Never Used  Substance and Sexual Activity   Alcohol use: No   Drug use: No   Sexual activity: Not Currently  Other Topics Concern   Not on file  Social History Narrative   Lives with wife, daughter and grand-daughter. He enjoys wood working, gardening and fishing.   Social Drivers of Corporate investment banker Strain: Low Risk  (10/01/2023)   Overall Financial Resource Strain (CARDIA)    Difficulty of Paying Living Expenses: Not very hard  Food Insecurity: No Food Insecurity (10/01/2023)   Hunger Vital Sign    Worried About Running Out of Food in the Last Year: Never true    Ran Out of Food in the Last Year: Never true  Transportation Needs: No Transportation Needs (10/01/2023)   PRAPARE -  Administrator, Civil Service (Medical): No    Lack of Transportation (Non-Medical): No  Physical Activity: Inactive (10/01/2023)   Exercise Vital Sign    Days of Exercise per Week: 0 days    Minutes of Exercise per Session: 30 min  Stress: Stress Concern Present (10/01/2023)   [employer] of Occupational Health - Occupational Stress Questionnaire    Feeling of Stress : To some extent  Social Connections: Unknown (10/01/2023)   Social Connection and Isolation Panel [NHANES]    Frequency of Communication with Friends and Family: More than three times a week    Frequency of Social Gatherings with Friends and Family: Once a week    Attends Religious Services:  More than 4 times per year    Active Member of Clubs or Organizations: Patient declined    Attends Banker Meetings: Patient declined    Marital Status: Married    Family History  Problem Relation Age of Onset   Cancer Father        lung CA   Diabetes Mother    COPD Brother    Drug abuse Brother    Early death Brother    Early death Brother     Health Maintenance  Topic Date Due   Zoster Vaccines- Shingrix (1 of 2) Never done   Diabetic kidney evaluation - Urine ACR  12/05/2023   COVID-19 Vaccine (4 - 2024-25 season) 07/23/2024 (Originally 05/26/2023)   INFLUENZA VACCINE  04/24/2024   HEMOGLOBIN A1C  07/07/2024   OPHTHALMOLOGY EXAM  07/09/2024   Medicare Annual Wellness (AWV)  07/22/2024   Diabetic kidney evaluation - eGFR measurement  11/04/2024   FOOT EXAM  01/05/2025   Fecal DNA (Cologuard)  08/24/2025   DTaP/Tdap/Td (3 - Td or Tdap) 07/06/2026   Pneumonia Vaccine 71+ Years old  Completed   Hepatitis C Screening  Completed   HPV VACCINES  Aged Out   Meningococcal B Vaccine  Aged Out     ----------------------------------------------------------------------------------------------------------------------------------------------------------------------------------------------------------------- Physical Exam BP 131/77 (BP Location: Left Arm, Patient Position: Sitting, Cuff Size: Normal)   Pulse 73   Ht 5\' 10"  (1.778 m)   Wt 213 lb (96.6 kg)   SpO2 99%   BMI 30.56 kg/m   Physical Exam Constitutional:      Appearance: Normal appearance.  HENT:     Head: Normocephalic and atraumatic.  Eyes:     General: No scleral icterus. Cardiovascular:     Rate and Rhythm: Normal rate and regular rhythm.  Musculoskeletal:     Cervical back: Neck supple.  Neurological:     Mental Status: He is alert.  Psychiatric:        Mood and Affect: Mood normal.        Behavior: Behavior normal.      ------------------------------------------------------------------------------------------------------------------------------------------------------------------------------------------------------------------- Assessment and Plan  Type 2 diabetes mellitus without complication, with long-term current use of insulin (HCC) Blood sugars are better controlled.  Continue xigudo BID with ryblesus at 7mg .  F/u in 6 months.   Essential hypertension Blood pressure mains fairly well-controlled.  Continue current medications for management of hypertension.  Acquired hypothyroidism Doing well with current strength of levothyroxine.  Update TSH  Hyperlipidemia associated with type 2 diabetes mellitus (HCC) Doing well with Crestor, continue current strength. Update lipids  Rotator cuff tear, right Continued pain. Recommend avoiding using naproxen and celebrex together.    No orders of the defined types were placed in this encounter.   No follow-ups on file.

## 2024-01-06 NOTE — Addendum Note (Signed)
 Addended by: Abundio Hoit on: 01/06/2024 10:54 AM   Modules accepted: Orders

## 2024-01-06 NOTE — Assessment & Plan Note (Signed)
 Blood sugars are better controlled.  Continue xigudo BID with ryblesus at 7mg .  F/u in 6 months.

## 2024-01-07 ENCOUNTER — Telehealth: Payer: Self-pay

## 2024-01-07 LAB — CMP14+EGFR
ALT: 15 IU/L (ref 0–44)
AST: 26 IU/L (ref 0–40)
Albumin: 4.4 g/dL (ref 3.9–4.9)
Alkaline Phosphatase: 38 IU/L — ABNORMAL LOW (ref 44–121)
BUN/Creatinine Ratio: 19 (ref 10–24)
BUN: 23 mg/dL (ref 8–27)
Bilirubin Total: 0.5 mg/dL (ref 0.0–1.2)
CO2: 23 mmol/L (ref 20–29)
Calcium: 9.7 mg/dL (ref 8.6–10.2)
Chloride: 100 mmol/L (ref 96–106)
Creatinine, Ser: 1.21 mg/dL (ref 0.76–1.27)
Globulin, Total: 1.8 g/dL (ref 1.5–4.5)
Glucose: 92 mg/dL (ref 70–99)
Potassium: 4.4 mmol/L (ref 3.5–5.2)
Sodium: 137 mmol/L (ref 134–144)
Total Protein: 6.2 g/dL (ref 6.0–8.5)
eGFR: 66 mL/min/{1.73_m2} (ref >59)

## 2024-01-07 LAB — CBC WITH DIFFERENTIAL/PLATELET
Basophils Absolute: 0 10*3/uL (ref 0.0–0.2)
Basos: 1 %
EOS (ABSOLUTE): 0.1 10*3/uL (ref 0.0–0.4)
Eos: 1 %
Hematocrit: 41.2 % (ref 37.5–51.0)
Hemoglobin: 13.5 g/dL (ref 13.0–17.7)
Immature Grans (Abs): 0 10*3/uL (ref 0.0–0.1)
Immature Granulocytes: 1 %
Lymphocytes Absolute: 1.2 10*3/uL (ref 0.7–3.1)
Lymphs: 28 %
MCH: 29.2 pg (ref 26.6–33.0)
MCHC: 32.8 g/dL (ref 31.5–35.7)
MCV: 89 fL (ref 79–97)
Monocytes Absolute: 0.3 10*3/uL (ref 0.1–0.9)
Monocytes: 8 %
Neutrophils Absolute: 2.7 10*3/uL (ref 1.4–7.0)
Neutrophils: 61 %
Platelets: 161 10*3/uL (ref 150–450)
RBC: 4.63 x10E6/uL (ref 4.14–5.80)
RDW: 13.6 % (ref 11.6–15.4)
WBC: 4.3 10*3/uL (ref 3.4–10.8)

## 2024-01-07 LAB — TSH: TSH: 0.333 u[IU]/mL — ABNORMAL LOW (ref 0.450–4.500)

## 2024-01-07 LAB — LIPID PANEL WITH LDL/HDL RATIO
Cholesterol, Total: 125 mg/dL (ref 100–199)
HDL: 37 mg/dL — ABNORMAL LOW (ref >39)
LDL Chol Calc (NIH): 64 mg/dL (ref 0–99)
LDL/HDL Ratio: 1.7 ratio (ref 0.0–3.6)
Triglycerides: 135 mg/dL (ref 0–149)
VLDL Cholesterol Cal: 24 mg/dL (ref 5–40)

## 2024-01-07 LAB — MICROALBUMIN / CREATININE URINE RATIO
Creatinine, Urine: 116 mg/dL
Microalb/Creat Ratio: 7 mg/g{creat} (ref 0–29)
Microalbumin, Urine: 7.8 ug/mL

## 2024-01-07 LAB — PSA: Prostate Specific Ag, Serum: 0.6 ng/mL (ref 0.0–4.0)

## 2024-01-07 LAB — SPECIMEN STATUS REPORT

## 2024-01-07 NOTE — Telephone Encounter (Unsigned)
 Copied from CRM [id number]. Topic: Clinical - Medication Question >> Jan 07, 2024 10:31 AM Oscar Gallagher wrote: Reason for CRM: Sensor (DEXCOM G7 SENSOR) MISC-Nick from [facility] is requesting call back due to Insurance is asking for diagnosis  Code for medication before they can fill this.   Locations: [facility] [place], [place] - [address] [address] [place] [address] Phone: [phone] Fax: [phone] Hours: Not open 24 hours

## 2024-01-07 NOTE — Telephone Encounter (Signed)
 Called pharmacy and gave DX code of E11.9.

## 2024-01-08 ENCOUNTER — Ambulatory Visit: Payer: Medicare (Managed Care)

## 2024-01-08 DIAGNOSIS — M25611 Stiffness of right shoulder, not elsewhere classified: Secondary | ICD-10-CM

## 2024-01-08 DIAGNOSIS — M6281 Muscle weakness (generalized): Secondary | ICD-10-CM

## 2024-01-08 DIAGNOSIS — R600 Localized edema: Secondary | ICD-10-CM

## 2024-01-08 DIAGNOSIS — M25511 Pain in right shoulder: Secondary | ICD-10-CM
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
## 2024-01-15 ENCOUNTER — Ambulatory Visit: Payer: Medicare (Managed Care)

## 2024-01-15 DIAGNOSIS — M25511 Pain in right shoulder: Secondary | ICD-10-CM

## 2024-01-15 DIAGNOSIS — R600 Localized edema: Secondary | ICD-10-CM

## 2024-01-15 DIAGNOSIS — M6281 Muscle weakness (generalized): Secondary | ICD-10-CM

## 2024-01-15 DIAGNOSIS — M25611 Stiffness of right shoulder, not elsewhere classified: Secondary | ICD-10-CM
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
## 2024-01-17 ENCOUNTER — Encounter: Payer: Self-pay | Admitting: Family Medicine

## 2024-01-22 ENCOUNTER — Ambulatory Visit: Payer: Medicare (Managed Care)

## 2024-01-22 ENCOUNTER — Other Ambulatory Visit: Payer: Medicare (Managed Care)

## 2024-01-22 ENCOUNTER — Telehealth: Payer: Self-pay

## 2024-01-22 DIAGNOSIS — M25511 Pain in right shoulder: Secondary | ICD-10-CM | POA: Diagnosis not present

## 2024-01-22 DIAGNOSIS — M25611 Stiffness of right shoulder, not elsewhere classified: Secondary | ICD-10-CM

## 2024-01-22 DIAGNOSIS — R600 Localized edema: Secondary | ICD-10-CM

## 2024-01-22 DIAGNOSIS — M6281 Muscle weakness (generalized): Secondary | ICD-10-CM
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
## 2024-01-22 NOTE — Telephone Encounter (Signed)
 Faxed change order for dose decreased to Rybelsus  7 mg to Adela Holter, DO office . Will fax to [facility] once received.

## 2024-01-22 NOTE — Progress Notes (Signed)
   01/22/2024  Patient ID: Oscar Gallagher, male   DOB: 03/13/1956, 68 y.o.   MRN: [id number]  Subjective/objective Telephone visit to follow-up on management of type 2 diabetes   Diabetes management plan -Current medications: Rybelsus  7 mg daily, Xigduo  XR 01/999 mg 1 tablet twice daily -Patient had recently decreased Rybelsus  to 7mg  from 14mg  due to n/v and endorses tolerating this dose much better -Using Dexcom G7 for CGM and endorses FBG 100-130 -A1c 4/14 was 6.6%, down from 7.6% in October -Does not endorse any signs or symptoms of hypo or hyperglycemia  Hypothyroidism -Current medications:  Synthroid  88mcg daily -TSH 0.333 4/14   Assessment/plan   Diabetes management plan -Continue Rybelsus  7mg  daily and Xigduo  XR 5/1000mg  BID -Collaborating with medication assistance team and PCP to send order change form for Rybelsus  7mg  to [facility], so he will receive this dose moving forward instead of 14mg  -Continue use of Dexcom G7 for CGM   Hypothyroidism -Verified patient is taking Synthroid  1st thing in the morning with only a sip of water and not eating or taking additional medications until at least 30 minutes after -Sees PCP again in June for f/u Follow up:  3 months   Linn Rich, PharmD, DPLA

## 2024-01-27 NOTE — Telephone Encounter (Signed)
 Faxed dose/change re-order form to [facility] per request for Rybelsus .

## 2024-01-29 ENCOUNTER — Ambulatory Visit: Payer: Medicare (Managed Care) | Attending: Orthopaedic Surgery

## 2024-01-29 DIAGNOSIS — R600 Localized edema: Secondary | ICD-10-CM | POA: Diagnosis not present

## 2024-01-29 DIAGNOSIS — M25611 Stiffness of right shoulder, not elsewhere classified: Secondary | ICD-10-CM | POA: Diagnosis not present

## 2024-01-29 DIAGNOSIS — M25511 Pain in right shoulder: Secondary | ICD-10-CM | POA: Insufficient documentation

## 2024-01-29 DIAGNOSIS — M6281 Muscle weakness (generalized): Secondary | ICD-10-CM | POA: Insufficient documentation
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
## 2024-02-04 DIAGNOSIS — M19011 Primary osteoarthritis, right shoulder: Secondary | ICD-10-CM | POA: Diagnosis not present

## 2024-02-05 ENCOUNTER — Ambulatory Visit: Payer: Medicare (Managed Care)

## 2024-02-05 DIAGNOSIS — M25511 Pain in right shoulder: Secondary | ICD-10-CM | POA: Diagnosis not present

## 2024-02-05 DIAGNOSIS — R600 Localized edema: Secondary | ICD-10-CM

## 2024-02-05 DIAGNOSIS — M6281 Muscle weakness (generalized): Secondary | ICD-10-CM

## 2024-02-05 DIAGNOSIS — M25611 Stiffness of right shoulder, not elsewhere classified: Secondary | ICD-10-CM
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
## 2024-02-06 ENCOUNTER — Other Ambulatory Visit: Payer: Self-pay | Admitting: Family Medicine

## 2024-02-06 NOTE — Telephone Encounter (Signed)
 Copied from CRM [id number]. Topic: Clinical - Prescription Issue >> Feb 06, 2024  3:47 PM Lenon Radar A wrote: Reason for CRM: Pharmacy called in regarding update for medication ramipril  (ALTACE ) 10 MG capsule. Patient is taking 2 capsules per day. Pharmacy is asking for corrected prescription for medication. Please resubmit to pharmacy.

## 2024-02-06 NOTE — Telephone Encounter (Signed)
 Copied from CRM [id number]. Topic: Clinical - Medication Refill >> Feb 06, 2024 10:28 AM Suzette B wrote: Medication:  ramipril  (ALTACE ) 10 MG capsule  Has the patient contacted their pharmacy? Yes Was informed he needed to call the office in order to request the refill; and to correct the dosage patient states before coming to the location he was prescribed 2 pills a day and this current prescription states one pill per day    (This is the patient's preferred pharmacy:  [facility] [place], [place] - [address] [place] [address] Phone: [phone] Fax: [phone]    Is this the correct pharmacy for this prescription? Yes If no, delete pharmacy and type the correct one.   Has the prescription been filled recently? Yes  Is the patient out of the medication? Yes  Has the patient been seen for an appointment in the last year OR does the patient have an upcoming appointment? Yes  Can we respond through MyChart? Yes  Agent: Please be advised that Rx refills may take up to 3 business days. We ask that you follow-up with your pharmacy.

## 2024-02-07 MED ORDER — RAMIPRIL 10 MG PO CAPS: 20.0000 mg | ORAL_CAPSULE | Freq: Every day | ORAL | 1 refills | Status: DC

## 2024-02-11 ENCOUNTER — Ambulatory Visit: Payer: Medicare (Managed Care)

## 2024-02-11 DIAGNOSIS — M6281 Muscle weakness (generalized): Secondary | ICD-10-CM

## 2024-02-11 DIAGNOSIS — M25611 Stiffness of right shoulder, not elsewhere classified: Secondary | ICD-10-CM

## 2024-02-11 DIAGNOSIS — R600 Localized edema: Secondary | ICD-10-CM

## 2024-02-11 DIAGNOSIS — M25511 Pain in right shoulder: Secondary | ICD-10-CM
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
## 2024-02-12 ENCOUNTER — Ambulatory Visit: Payer: Medicare (Managed Care)

## 2024-02-19 ENCOUNTER — Ambulatory Visit: Payer: Medicare (Managed Care)

## 2024-02-19 DIAGNOSIS — M25511 Pain in right shoulder: Secondary | ICD-10-CM | POA: Diagnosis not present

## 2024-02-19 DIAGNOSIS — R600 Localized edema: Secondary | ICD-10-CM

## 2024-02-19 DIAGNOSIS — M25611 Stiffness of right shoulder, not elsewhere classified: Secondary | ICD-10-CM

## 2024-02-19 DIAGNOSIS — M6281 Muscle weakness (generalized): Secondary | ICD-10-CM
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
## 2024-02-25 ENCOUNTER — Ambulatory Visit: Payer: Medicare (Managed Care) | Attending: Orthopaedic Surgery

## 2024-02-25 ENCOUNTER — Telehealth: Payer: Self-pay | Admitting: *Deleted

## 2024-02-25 DIAGNOSIS — M25511 Pain in right shoulder: Secondary | ICD-10-CM | POA: Diagnosis not present

## 2024-02-25 DIAGNOSIS — M25611 Stiffness of right shoulder, not elsewhere classified: Secondary | ICD-10-CM | POA: Insufficient documentation

## 2024-02-25 DIAGNOSIS — R600 Localized edema: Secondary | ICD-10-CM | POA: Diagnosis not present

## 2024-02-25 DIAGNOSIS — M6281 Muscle weakness (generalized): Secondary | ICD-10-CM | POA: Insufficient documentation

## 2024-02-25 NOTE — Telephone Encounter (Signed)
 Called and informed pt that medication will be at the front desk for him to pick up.
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
## 2024-02-25 NOTE — Telephone Encounter (Signed)
 Received 4 boxes of Rybelsus  7 mg from patient assistance.  LOT # [id number]  Exp: 07/24/2025  NCD: [id number].

## 2024-03-03 ENCOUNTER — Ambulatory Visit: Payer: Medicare (Managed Care)

## 2024-03-03 DIAGNOSIS — M25511 Pain in right shoulder: Secondary | ICD-10-CM

## 2024-03-03 DIAGNOSIS — M6281 Muscle weakness (generalized): Secondary | ICD-10-CM

## 2024-03-03 DIAGNOSIS — R600 Localized edema: Secondary | ICD-10-CM

## 2024-03-03 DIAGNOSIS — M25611 Stiffness of right shoulder, not elsewhere classified: Secondary | ICD-10-CM
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
## 2024-03-09 NOTE — Progress Notes (Signed)
   03/09/2024  Patient ID: Oscar Gallagher, male   DOB: 01/28/1956, 69 y.o.   MRN: [id number]  This patient is appearing on a report for being at risk of failing the adherence measure for hypertension (ACEi/ARB) medications this calendar year.   Medication: ramipril  10mg  Last fill date: 02/07/24 for 90 day supply  Insurance report was not up to date. No action needed at this time.   Linn Rich, PharmD, DPLA

## 2024-03-10 ENCOUNTER — Ambulatory Visit (INDEPENDENT_AMBULATORY_CARE_PROVIDER_SITE_OTHER): Payer: Medicare (Managed Care) | Admitting: Family Medicine

## 2024-03-10 ENCOUNTER — Encounter: Payer: Self-pay | Admitting: Family Medicine

## 2024-03-10 VITALS — BP 129/77 | HR 70 | Ht 70.0 in | Wt 216.0 lb

## 2024-03-10 DIAGNOSIS — M5412 Radiculopathy, cervical region: Secondary | ICD-10-CM

## 2024-03-10 DIAGNOSIS — M51369 Other intervertebral disc degeneration, lumbar region without mention of lumbar back pain or lower extremity pain: Secondary | ICD-10-CM | POA: Diagnosis not present

## 2024-03-10 DIAGNOSIS — M25511 Pain in right shoulder: Secondary | ICD-10-CM | POA: Diagnosis not present

## 2024-03-10 DIAGNOSIS — E039 Hypothyroidism, unspecified: Secondary | ICD-10-CM | POA: Diagnosis not present

## 2024-03-10 DIAGNOSIS — R21 Rash and other nonspecific skin eruption: Secondary | ICD-10-CM

## 2024-03-10 DIAGNOSIS — Z0000 Encounter for general adult medical examination without abnormal findings: Secondary | ICD-10-CM | POA: Diagnosis not present

## 2024-03-10 DIAGNOSIS — M5441 Lumbago with sciatica, right side: Secondary | ICD-10-CM

## 2024-03-10 MED ORDER — CLOTRIMAZOLE-BETAMETHASONE 1-0.05 % EX CREA: 1.0000 | TOPICAL_CREAM | Freq: Two times a day (BID) | CUTANEOUS | 1 refills | Status: AC

## 2024-03-10 MED ORDER — NAPROXEN 500 MG PO TABS: 500.0000 mg | ORAL_TABLET | Freq: Two times a day (BID) | ORAL | 1 refills | Status: AC

## 2024-03-10 NOTE — Assessment & Plan Note (Signed)
 Well adult Orders Placed This Encounter  Procedures   TSH   Ambulatory referral to Pain Clinic    Referral Priority:   Routine    Referral Type:   Consultation    Referral Reason:   Specialty Services Required    Referred to Provider:   Domenica Fried, MD    Requested Specialty:   Pain Medicine    Number of Visits Requested:   1  Screenings: per lab orders Immunizations: Declines.  Anticipatory guidance/Recommendations: per AVS.

## 2024-03-10 NOTE — Patient Instructions (Signed)
 Preventive Care 73 Years and Older, Male Preventive care refers to lifestyle choices and visits with your health care provider that can promote health and wellness. Preventive care visits are also called wellness exams. What can I expect for my preventive care visit? Counseling During your preventive care visit, your health care provider may ask about your: Medical history, including: Past medical problems. Family medical history. History of falls. Current health, including: Emotional well-being. Home life and relationship well-being. Sexual activity. Memory and ability to understand (cognition). Lifestyle, including: Alcohol, nicotine or tobacco, and drug use. Access to firearms. Diet, exercise, and sleep habits. Work and work Astronomer. Sunscreen use. Safety issues such as seatbelt and bike helmet use. Physical exam Your health care provider will check your: Height and weight. These may be used to calculate your BMI (body mass index). BMI is a measurement that tells if you are at a healthy weight. Waist circumference. This measures the distance around your waistline. This measurement also tells if you are at a healthy weight and may help predict your risk of certain diseases, such as type 2 diabetes and high blood pressure. Heart rate and blood pressure. Body temperature. Skin for abnormal spots. What immunizations do I need?  Vaccines are usually given at various ages, according to a schedule. Your health care provider will recommend vaccines for you based on your age, medical history, and lifestyle or other factors, such as travel or where you work. What tests do I need? Screening Your health care provider may recommend screening tests for certain conditions. This may include: Lipid and cholesterol levels. Diabetes screening. This is done by checking your blood sugar (glucose) after you have not eaten for a while (fasting). Hepatitis C test. Hepatitis B test. HIV (human  immunodeficiency virus) test. STI (sexually transmitted infection) testing, if you are at risk. Lung cancer screening. Colorectal cancer screening. Prostate cancer screening. Abdominal aortic aneurysm (AAA) screening. You may need this if you are a current or former smoker. Talk with your health care provider about your test results, treatment options, and if necessary, the need for more tests. Follow these instructions at home: Eating and drinking  Eat a diet that includes fresh fruits and vegetables, whole grains, lean protein, and low-fat dairy products. Limit your intake of foods with high amounts of sugar, saturated fats, and salt. Take vitamin and mineral supplements as recommended by your health care provider. Do not drink alcohol if your health care provider tells you not to drink. If you drink alcohol: Limit how much you have to 0-2 drinks a day. Know how much alcohol is in your drink. In the [place] one drink equals one 12 oz bottle of beer (355 mL), one 5 oz glass of wine (148 mL), or one 1 oz glass of hard liquor (44 mL). Lifestyle Brush your teeth every morning and night with fluoride toothpaste. Floss one time each day. Exercise for at least 30 minutes 5 or more days each week. Do not use any products that contain nicotine or tobacco. These products include cigarettes, chewing tobacco, and vaping devices, such as e-cigarettes. If you need help quitting, ask your health care provider. Do not use drugs. If you are sexually active, practice safe sex. Use a condom or other form of protection to prevent STIs. Take aspirin only as told by your health care provider. Make sure that you understand how much to take and what form to take. Work with your health care provider to find out whether it is safe  and beneficial for you to take aspirin daily. Ask your health care provider if you need to take a cholesterol-lowering medicine (statin). Find healthy ways to manage stress, such  as: Meditation, yoga, or listening to music. Journaling. Talking to a trusted person. Spending time with friends and family. Safety Always wear your seat belt while driving or riding in a vehicle. Do not drive: If you have been drinking alcohol. Do not ride with someone who has been drinking. When you are tired or distracted. While texting. If you have been using any mind-altering substances or drugs. Wear a helmet and other protective equipment during sports activities. If you have firearms in your house, make sure you follow all gun safety procedures. Minimize exposure to UV radiation to reduce your risk of skin cancer. What's next? Visit your health care provider once a year for an annual wellness visit. Ask your health care provider how often you should have your eyes and teeth checked. Stay up to date on all vaccines. This information is not intended to replace advice given to you by your health care provider. Make sure you discuss any questions you have with your health care provider. Document Revised: 03/08/2021 Document Reviewed: 03/08/2021 Elsevier Patient Education  2024 [employer].

## 2024-03-10 NOTE — Progress Notes (Signed)
 Oscar Gallagher - 68 y.o. male MRN [id number]  Date of birth: 27-Dec-1955  Subjective Chief Complaint  Patient presents with   Annual Exam    HPI Oscar Gallagher is a 68 y.o. male here today for annual exam.   He is noticing increased back pain with radiation into the R leg.  Had ESI last year with Dr. Vonn Guard.  Requesting referral to have this done again.    He is somewhat active.  Limited by orthopedic issues.  He is working on [employer].   He is a non-smoker.  He denies EtOH use.   Review of Systems  Constitutional:  Negative for chills, fever, malaise/fatigue and weight loss.  HENT:  Negative for congestion, ear pain and sore throat.   Eyes:  Negative for blurred vision, double vision and pain.  Respiratory:  Negative for cough and shortness of breath.   Cardiovascular:  Negative for chest pain and palpitations.  Gastrointestinal:  Negative for abdominal pain, blood in stool, constipation, heartburn and nausea.  Genitourinary:  Negative for dysuria and urgency.  Musculoskeletal:  Negative for joint pain and myalgias.  Neurological:  Negative for dizziness and headaches.  Endo/Heme/Allergies:  Does not bruise/bleed easily.  Psychiatric/Behavioral:  Negative for depression. The patient is not nervous/anxious and does not have insomnia.     Allergies  Allergen Reactions   Aspirin Other (See Comments)    Reports a history of Peptic Ulcer Disease    Past Medical History:  Diagnosis Date   Anemia    Taking Vitamin D   Arthritis    Asthma    Diabetes mellitus without complication (HCC)    GERD (gastroesophageal reflux disease)    Hyperlipidemia    Hypertension    Stroke [facility])    Thyroid  disease    Ulcer    Well adult exam 03/10/2024    Past Surgical History:  Procedure Laterality Date   CARPAL TUNNEL RELEASE Right 02/10/2019   ELBOW FRACTURE SURGERY Left    Prosthesis on radial bone   HERNIA REPAIR     JOINT REPLACEMENT     SHOULDER ARTHROSCOPY WITH DISTAL  CLAVICLE RESECTION Right 11/07/2023   Procedure: SHOULDER ARTHROSCOPY WITH DISTAL CLAVICLE EXCISION;  Surgeon: Micheline Ahr, MD;  Location: [facility];  Service: Orthopedics;  Laterality: Right;   SHOULDER ARTHROSCOPY WITH SUBACROMIAL DECOMPRESSION, ROTATOR CUFF REPAIR AND BICEP TENDON REPAIR Right 11/07/2023   Procedure: SHOULDER ARTHROSCOPY WITH SUBACROMIAL DECOMPRESSION, ROTATOR CUFF REPAIR AND BICEP TENDON REPAIR;  Surgeon: Micheline Ahr, MD;  Location: [facility];  Service: Orthopedics;  Laterality: Right;    Social History   Socioeconomic History   Marital status: Married    Spouse name: Dwain Giovanni   Number of children: 1   Years of education: 12   Highest education level: Some college, no degree  Occupational History   Occupation: Retired  Tobacco Use   Smoking status: Never   Smokeless tobacco: Never  Vaping Use   Vaping status: Never Used  Substance and Sexual Activity   Alcohol use: No   Drug use: No   Sexual activity: Not Currently  Other Topics Concern   Not on file  Social History Narrative   Lives with wife, daughter and grand-daughter. He enjoys wood working, gardening and fishing.   Social Drivers of Corporate investment banker Strain: Low Risk  (03/06/2024)   Overall Financial Resource Strain (CARDIA)    Difficulty of Paying Living Expenses: Not hard at all  Food Insecurity:  No Food Insecurity (03/06/2024)   Hunger Vital Sign    Worried About Running Out of Food in the Last Year: Never true    Ran Out of Food in the Last Year: Never true  Transportation Needs: No Transportation Needs (03/06/2024)   PRAPARE - Administrator, Civil Service (Medical): No    Lack of Transportation (Non-Medical): No  Physical Activity: Insufficiently Active (03/06/2024)   Exercise Vital Sign    Days of Exercise per Week: 2 days    Minutes of Exercise per Session: 30 min  Stress: No Stress Concern Present (03/06/2024)   [employer] of  Occupational Health - Occupational Stress Questionnaire    Feeling of Stress: Only a little  Social Connections: Moderately Integrated (03/06/2024)   Social Connection and Isolation Panel    Frequency of Communication with Friends and Family: Three times a week    Frequency of Social Gatherings with Friends and Family: Twice a week    Attends Religious Services: More than 4 times per year    Active Member of Clubs or Organizations: No    Attends Engineer, structural: Not on file    Marital Status: Married    Family History  Problem Relation Age of Onset   Cancer Father        lung CA   Diabetes Mother    COPD Brother    Drug abuse Brother    Early death Brother    Early death Brother     Health Maintenance  Topic Date Due   Zoster Vaccines- Shingrix (1 of 2) Never done   COVID-19 Vaccine (4 - 2024-25 season) 07/23/2024 (Originally 05/26/2023)   INFLUENZA VACCINE  04/24/2024   HEMOGLOBIN A1C  07/07/2024   OPHTHALMOLOGY EXAM  07/09/2024   Medicare Annual Wellness (AWV)  07/22/2024   Diabetic kidney evaluation - eGFR measurement  01/05/2025   Diabetic kidney evaluation - Urine ACR  01/05/2025   FOOT EXAM  01/05/2025   Fecal DNA (Cologuard)  08/24/2025   DTaP/Tdap/Td (3 - Td or Tdap) 07/06/2026   Pneumococcal Vaccine: 50+ Years  Completed   Hepatitis C Screening  Completed   HPV VACCINES  Aged Out   Meningococcal B Vaccine  Aged Out     ----------------------------------------------------------------------------------------------------------------------------------------------------------------------------------------------------------------- Physical Exam BP 129/77 (BP Location: Left Arm, Patient Position: Sitting, Cuff Size: Normal)   Pulse 70   Ht 5' 10 (1.778 m)   Wt 216 lb (98 kg)   SpO2 100%   BMI 30.99 kg/m   Physical Exam Constitutional:      General: He is not in acute distress. HENT:     Head: Normocephalic and atraumatic.     Right Ear: Tympanic  membrane and external ear normal.     Left Ear: Tympanic membrane and external ear normal.   Eyes:     General: No scleral icterus.  Neck:     Thyroid : No thyromegaly.   Cardiovascular:     Rate and Rhythm: Normal rate and regular rhythm.     Heart sounds: Normal heart sounds.  Pulmonary:     Effort: Pulmonary effort is normal.     Breath sounds: Normal breath sounds.  Abdominal:     General: Bowel sounds are normal. There is no distension.     Palpations: Abdomen is soft.     Tenderness: There is no abdominal tenderness. There is no guarding.   Musculoskeletal:     Cervical back: Normal range of motion.  Lymphadenopathy:  Cervical: No cervical adenopathy.   Skin:    General: Skin is warm and dry.     Findings: No rash.   Neurological:     Mental Status: He is alert and oriented to person, place, and time.     Cranial Nerves: No cranial nerve deficit.     Motor: No abnormal muscle tone.   Psychiatric:        Mood and Affect: Mood normal.        Behavior: Behavior normal.     ------------------------------------------------------------------------------------------------------------------------------------------------------------------------------------------------------------------- Assessment and Plan  Well adult exam Well adult Orders Placed This Encounter  Procedures   TSH   Ambulatory referral to Pain Clinic    Referral Priority:   Routine    Referral Type:   Consultation    Referral Reason:   Specialty Services Required    Referred to Provider:   Domenica Fried, MD    Requested Specialty:   Pain Medicine    Number of Visits Requested:   1  Screenings: per lab orders Immunizations: Declines.  Anticipatory guidance/Recommendations: per AVS.    Meds ordered this encounter  Medications   clotrimazole -betamethasone  (LOTRISONE ) cream    Sig: Apply 1 Application topically 2 (two) times daily.    Dispense:  90 g    Refill:  1   naproxen  (NAPROSYN ) 500  MG tablet    Sig: Take 1 tablet (500 mg total) by mouth 2 (two) times daily with a meal.    Dispense:  60 tablet    Refill:  1    No follow-ups on file.

## 2024-03-11 ENCOUNTER — Ambulatory Visit: Payer: Medicare (Managed Care)

## 2024-03-11 DIAGNOSIS — M25511 Pain in right shoulder: Secondary | ICD-10-CM

## 2024-03-11 DIAGNOSIS — R600 Localized edema: Secondary | ICD-10-CM

## 2024-03-11 DIAGNOSIS — M25611 Stiffness of right shoulder, not elsewhere classified: Secondary | ICD-10-CM

## 2024-03-11 DIAGNOSIS — M6281 Muscle weakness (generalized): Secondary | ICD-10-CM

## 2024-03-11 LAB — TSH: TSH: 0.452 u[IU]/mL (ref 0.450–4.500)
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
## 2024-03-13 ENCOUNTER — Ambulatory Visit: Payer: Self-pay | Admitting: Family Medicine

## 2024-03-18 ENCOUNTER — Other Ambulatory Visit: Payer: Self-pay | Admitting: Family Medicine

## 2024-04-21 ENCOUNTER — Other Ambulatory Visit: Payer: Self-pay | Admitting: Family Medicine

## 2024-04-21 DIAGNOSIS — K219 Gastro-esophageal reflux disease without esophagitis: Secondary | ICD-10-CM

## 2024-04-21 DIAGNOSIS — E1169 Type 2 diabetes mellitus with other specified complication: Secondary | ICD-10-CM

## 2024-04-22 ENCOUNTER — Other Ambulatory Visit: Payer: Self-pay

## 2024-04-22 ENCOUNTER — Other Ambulatory Visit: Payer: Medicare (Managed Care)

## 2024-04-22 NOTE — Progress Notes (Signed)
   04/22/2024  Patient ID: Oscar Gallagher, male   DOB: July 30, 1956, 68 y.o.   MRN: [id number]   Subjective/objective Telephone visit to follow-up on management of type 2 diabetes   Diabetes management plan -Current medications: Rybelsus  7 mg daily, Xigduo  XR 01/999 mg 1 tablet twice daily -Patient receives Rybelsus  through [facility] PAP and is tolerating the 7mg  dose well.  Endorses having another 2 bottles on hand currently. -Receives Xigduo  from [facility] and current has 2 bottles on hand -Using Dexcom G7 for CGM and endorses FBG 100-120, but this will go up to around 220 after eating certain meals -A1c 4/14 was 6.6%, down from 7.6% in October -Does not endorse any signs or symptoms of hypo or hyperglycemia   Hypothyroidism -Current medications:  Synthroid  88mcg daily -TSH 0.452 in June   Assessment/plan   Diabetes management plan -Continue Rybelsus  7mg  daily and Xigduo  XR 5/1000mg  BID -Patient will notify me when down to 1 bottle of Rybelsus  7mg , so we can work on reorder form -Educated on importance of adequate hydration as well as choosing carb conscious meals to prevent post-prandial BG spikes (did not realize potatoes would cause elevated post-prandial readings) -Continue use of Dexcom G7 for CGM   Hypothyroidism -Patient is taking Synthroid  1st thing in the morning with only a sip of water and not eating or taking additional medications until at least 30 minutes after -Continue current regimen  Follow up:  3 months   [name], PharmD, DPLA

## 2024-05-14 DIAGNOSIS — L649 Androgenic alopecia, unspecified: Secondary | ICD-10-CM | POA: Diagnosis not present

## 2024-05-14 DIAGNOSIS — R233 Spontaneous ecchymoses: Secondary | ICD-10-CM | POA: Diagnosis not present

## 2024-05-14 DIAGNOSIS — D2239 Melanocytic nevi of other parts of face: Secondary | ICD-10-CM | POA: Diagnosis not present

## 2024-05-14 DIAGNOSIS — L820 Inflamed seborrheic keratosis: Secondary | ICD-10-CM | POA: Diagnosis not present

## 2024-05-14 DIAGNOSIS — L570 Actinic keratosis: Secondary | ICD-10-CM | POA: Diagnosis not present

## 2024-05-26 ENCOUNTER — Encounter: Payer: Self-pay | Admitting: Sports Medicine

## 2024-06-02 DIAGNOSIS — M19011 Primary osteoarthritis, right shoulder: Secondary | ICD-10-CM | POA: Diagnosis not present

## 2024-06-03 ENCOUNTER — Other Ambulatory Visit: Payer: Self-pay | Admitting: Family Medicine

## 2024-06-08 ENCOUNTER — Telehealth: Payer: Self-pay

## 2024-06-08 MED ORDER — DAPAGLIFLOZIN PROPANEDIOL 10 MG PO TABS: 10.0000 mg | ORAL_TABLET | Freq: Every day | ORAL | 4 refills | Status: DC

## 2024-06-08 MED ORDER — METFORMIN HCL ER 500 MG PO TB24
1000.0000 mg | ORAL_TABLET | Freq: Two times a day (BID) | ORAL | 3 refills | Status: DC
Start: 2024-06-08 — End: 2024-06-17

## 2024-06-08 NOTE — Addendum Note (Signed)
 Addended by: DEANNA ROSELLA A on: 06/08/2024 02:36 PM   Modules accepted: Orders

## 2024-06-08 NOTE — Telephone Encounter (Signed)
 Copied from CRM [id number]. Topic: Clinical - Medication Question >> Jun 05, 2024 11:56 AM Mercer PEDLAR wrote: Reason for CRM: Patient is requesting a callback from Channing Mealing regarding XIGDUO  XR 01-999 MG TB24 and Semaglutide  (RYBELSUS ) 7 MG TABS.

## 2024-06-08 NOTE — Progress Notes (Signed)
   06/08/2024  Patient ID: Oscar Gallagher, male   DOB: Aug 10, 1956, 68 y.o.   MRN: [id number]  In basket message from PCP office stating patient was requesting a telephone call from me.  Patient is down to his last bottle of Xigduo  5/1000mg  that he takes twice daily and his Rybelsus  7mg  he takes daily.  Patient is well controlled on this regimen, with his last A1c being 6.6% in April.  He uses Dexcom for CGM and endorses FBG 100-120.  Coordinating with the medication assistance team to make sure patient has active refills available for Xigduo  through [facility] PAP and Rybelsus  through [facility] P[facility].  [name], PharmD, DPLA

## 2024-06-08 NOTE — Progress Notes (Signed)
   06/08/2024  Patient ID: Oscar Gallagher, male   DOB: [birth year], 68 y.o.   MRN: [id number]  Medication assistance team contacted [facility] PAP, and a refill for Rybelsus  7mg  has already gone into process; so this should arrive at [facility] in the next 2-3 weeks.  [facility] no longer offers Xigduo  through their PAP, so a new order for Farxiga  10mg  daily will need to be sent to [facility] along with an order to [facility] for metformin  XR 1000mg  BID- patient was taking XIGDUO  XR 5/1000mg  BID.  Oscar Gallagher, PharmD, DPLA

## 2024-06-15 ENCOUNTER — Telehealth: Payer: Self-pay

## 2024-06-15 ENCOUNTER — Other Ambulatory Visit: Payer: Self-pay | Admitting: Family Medicine

## 2024-06-15 NOTE — Telephone Encounter (Signed)
 Copied from CRM [id number]. Topic: Clinical - Medication Question >> Jun 15, 2024 12:36 PM Rosaria A wrote: Reason for CRM: Patient is wanting PCP to call in Xigduo  Xr 5mg /1000mg  for him. Per patient he use to get the medication through [facility] medical assistance. Patient states that provider is not handling the medication anymore. He called the pharmacy to see if he could get a price. The pharmacy states that they cannot give him a price until the prescription is sent to them. Please call patient back to discuss.

## 2024-06-16 LAB — HM DIABETES EYE EXAM

## 2024-06-16 MED ORDER — DAPAGLIFLOZIN PRO-METFORMIN ER 10-1000 MG PO TB24: 1.0000 | ORAL_TABLET | Freq: Every day | ORAL | 1 refills | Status: DC

## 2024-06-16 NOTE — Progress Notes (Unsigned)
   06/17/2024  Patient ID: Oscar Gallagher, male   DOB: [birth year], 68 y.o.   MRN: [id number]  Subjective/objective Telephone visit to follow-up on management of type 2 diabetes   Diabetes management plan -Current medications: Rybelsus  7 mg daily, Xigduo  XR 01/999 mg 1 tablet twice daily -Patient receives Rybelsus  through [facility] PAP and is tolerating the 7mg  dose well.  Endorses having another 2 bottles on hand currently.  He was not able to tolerate 14mg  dose in the past. -Receives Xigduo  from [facility], but this program no longer provides Xigduo .  I had suggested continuing to receive Farxiga  10mg  daily through [facility] and fill metformin  XR 1000mg  BID at patient's pharmacy.  Patient states he was on this regimen before, and A1c was not as well controlled. -Dr. Estil sent an order for Xigduo  XR 10/1000mg  daily to patient's pharmacy, and pharmacy states this will be $45 on patient's insurance, which will be affordable.  However, Oscar Gallagher would like to remain on 5/1000mg   BID. -Using Dexcom G7 for CGM and endorses FBG 100-120, but this will go up to around 220 after eating certain meals -A1c 4/14 was 6.6%, down from 7.6% in October -Does not endorse any signs or symptoms of hypo or hyperglycemia   Assessment/plan   Diabetes management plan -Continue Rybelsus  7mg  daily and Xigduo  XR 5/1000mg  BID -Order pending for XigduoXR 5/1000mg  BID for Dr. Alvia to sign if in agreement -Advised patient to reach out if copay if going to be >$45, or if he decides he would like to get Farxiga  10mg  daily through [facility] PAP and fill metformin  XR 1000mg  BID at pharmacy for cost savings -Continue using Dexcom G7 for CGM -Sees PCP again in December and will be due for A1c   Follow up:  1 month   [name], PharmD, DPLA

## 2024-06-17 ENCOUNTER — Other Ambulatory Visit: Payer: Medicare (Managed Care)

## 2024-06-17 DIAGNOSIS — M47816 Spondylosis without myelopathy or radiculopathy, lumbar region: Secondary | ICD-10-CM | POA: Diagnosis not present

## 2024-06-17 DIAGNOSIS — M48061 Spinal stenosis, lumbar region without neurogenic claudication: Secondary | ICD-10-CM | POA: Diagnosis not present

## 2024-06-17 MED ORDER — DAPAGLIFLOZIN PRO-METFORMIN ER 5-1000 MG PO TB24: 1.0000 | ORAL_TABLET | Freq: Two times a day (BID) | ORAL | 2 refills | Status: DC

## 2024-06-17 NOTE — Telephone Encounter (Signed)
 Patient advised.

## 2024-06-23 ENCOUNTER — Telehealth: Payer: Self-pay

## 2024-06-23 NOTE — Telephone Encounter (Signed)
 Forwarding to [place] as an Financial planner.  Oscar Gallagher  [facility] PAP shipment received today for: Rybelsus , 7 mg /4 boxes  Please contact the patient to come and pick up their order today. Medication(s) placed in the PAP cabinet (front office) with patient identifier. Thanks in advance.   NDC: 9830-5692-69 LOT: [id number] EXP: 2025-09-23

## 2024-07-06 DIAGNOSIS — M48061 Spinal stenosis, lumbar region without neurogenic claudication: Secondary | ICD-10-CM | POA: Diagnosis not present

## 2024-07-06 DIAGNOSIS — M4726 Other spondylosis with radiculopathy, lumbar region: Secondary | ICD-10-CM | POA: Diagnosis not present

## 2024-07-06 NOTE — Progress Notes (Signed)
 Date of service: 07/06/2024  SURGEON: Alm Lower MD  PREOPERATIVE DIAGNOSIS:  Lumbar Degenerative Disc Disease  POSTOPERATIVE DIAGNOSIS:  Lumbar Degenerative Disc Disease  PROCEDURE:  right L4-5 NERVE ROOT BLOCK/TRANSFORAMINAL EPIDURAL INJECTION WITH FLUOROSCOPY   LEVELS TREATED: right L4-5  THERAPEUTIC AGENT: 1cc 0.25% bupivacaine  + 40mg  depomedrol + 1 cc Isovue 200 per level  BLOOD LOSS: minimal  COMPLICATIONS: none  DESCRIPTION:  After obtaining informed consent, the patient was positioned on the padded fluoroscopy table. Fluoroscopy was utilized to determine the site for needle placement. The skin at this site was prepped with antiseptic solution and draped in the usual fashion. Local anesthesia was administered. A sterile styletted block needle was advanced through the anesthetized skin and manipulated into position in the  L4-5 neuroforamen. Aspiration was then negative for heme, CSF or air. Radiographic contrast was administered through the needle under fluoroscopic observation. The contrast spread confirmed needle-tip placement adjacent to the nerve and showed spread of contrast medial to the pedicles within the epidural space but no vascular uptake. The therapeutic agent was administered in increments. The needle was removed intact and discarded.   The patient tolerated the procedure well and recovered uneventfully in the pain center recovery room. The procedure was performed as stated above. Discharge instructions were given.   ADDENDUM:  Fluoroscopy time and Exposure, mRad were recorded and scanned into chart for procedure. Image count stored.

## 2024-07-15 ENCOUNTER — Other Ambulatory Visit: Payer: Medicare (Managed Care)

## 2024-07-15 ENCOUNTER — Other Ambulatory Visit: Payer: Self-pay

## 2024-07-15 DIAGNOSIS — E1169 Type 2 diabetes mellitus with other specified complication: Secondary | ICD-10-CM

## 2024-07-15 DIAGNOSIS — E119 Type 2 diabetes mellitus without complications: Secondary | ICD-10-CM

## 2024-07-15 DIAGNOSIS — I10 Essential (primary) hypertension: Secondary | ICD-10-CM

## 2024-07-15 NOTE — Progress Notes (Signed)
   07/15/2024  Patient ID: Oscar Gallagher, male   DOB: 01/30/1956, 68 y.o.   MRN: [id number]  Subjective/objective Telephone visit to follow-up on management of type 2 diabetes   Diabetes management plan -Current medications: Rybelsus  7 mg daily, Xigduo  XR 01/999 mg 1 tablet twice daily -Patient receives Rybelsus  through [facility] PAP and is tolerating the 7mg  dose well.  He did not tolerate the 14mg  dose well in the past.  Just picked up 4 boxes of Rybelsus  7mg  from [facility]. -Patient was receiving Xigduo  through [facility] PAP, but this medication is no longer available through the program.  Patient wanted to remain on current regimen and has been able to refill medication on insurance for $45.  States Farxiga  and metformin  taking as 2 separate medications were not as effective for him as Xigduo . -Using Dexcom G7 for CGM and endorses FBG 120, but this will go up over  200 after eating  -Dexcom data reflects GMI of 7%, with patient in range 74% of the time (24% high, 1 % very high, and 1% low) -Patient would like a referral to nutritionist/dietician to help with dietary modifications to improve blood sugar control -A1c 4/14 was 6.6%  -Does not endorse any signs or symptoms of hypo or hyperglycemia -ACEi/ARB for cardiorenal protection:  ramipril  20mg  daily; last OV BP was 129/77 on 6/17 -UACR of 4 >1 year ago -Statin on board for ASCVD reduction:  rosuvastatin  10mg  daily; LDL 64 in April   Assessment/plan   Diabetes management plan -Controlled based on last A1c, but patient does seem to be experiencing post-prandial hyperglycemia, likely related to dietary choices -Coordinating with Dr. Alvia to place a nutrition referral (patient in agreement/requested) -Continue Rybelsus  7mg  daily and Xigduo  XR 5/1000mg  BID at this time -Order pending for [place] 5/1000mg  BID for Dr. Alvia to sign if in agreement -Continue using Dexcom G7 for CGM -Sees PCP again in December and will be due for A1c -Advised  patient that starting in January, Rybelsus  will no longer be available through [facility] PAP.  Patient has an appointment to look at [facility] plans with his advisor next month and will look into coverage for Rybelsus  (and Xigduo ).     Follow up:  1 month to discuss insurance coverage of Rybelsus  for 2026 and apply for Medicare Extra Help   [name], PharmD, DPLA

## 2024-07-15 NOTE — Progress Notes (Signed)
 Per Cheryl's request referral for nutrition placed for the patient.

## 2024-07-17 DIAGNOSIS — Z9889 Other specified postprocedural states: Secondary | ICD-10-CM | POA: Diagnosis not present

## 2024-07-17 DIAGNOSIS — R29898 Other symptoms and signs involving the musculoskeletal system: Secondary | ICD-10-CM | POA: Diagnosis not present

## 2024-07-23 ENCOUNTER — Other Ambulatory Visit: Payer: Medicare (Managed Care)

## 2024-07-27 ENCOUNTER — Encounter: Payer: Self-pay | Admitting: Radiology

## 2024-07-28 ENCOUNTER — Ambulatory Visit (INDEPENDENT_AMBULATORY_CARE_PROVIDER_SITE_OTHER): Payer: Medicare (Managed Care)

## 2024-07-28 VITALS — BP 137/67 | HR 73 | Ht 70.0 in | Wt 209.0 lb

## 2024-07-28 DIAGNOSIS — Z0000 Encounter for general adult medical examination without abnormal findings: Secondary | ICD-10-CM | POA: Diagnosis not present

## 2024-07-28 NOTE — Progress Notes (Signed)
 Subjective:   Oscar Gallagher is a 68 y.o. male who presents for a Medicare Annual Wellness Visit.  Allergies (verified) Aspirin   History: Past Medical History:  Diagnosis Date   Anemia    Taking Vitamin D   Arthritis    Asthma    Diabetes mellitus without complication (HCC)    GERD (gastroesophageal reflux disease)    Hyperlipidemia    Hypertension    Stroke [facility])    Thyroid  disease    Ulcer    Well adult exam 03/10/2024   Past Surgical History:  Procedure Laterality Date   CARPAL TUNNEL RELEASE Right 02/10/2019   ELBOW FRACTURE SURGERY Left    Prosthesis on radial bone   HERNIA REPAIR     JOINT REPLACEMENT     SHOULDER ARTHROSCOPY WITH DISTAL CLAVICLE RESECTION Right 11/07/2023   Procedure: SHOULDER ARTHROSCOPY WITH DISTAL CLAVICLE EXCISION;  Surgeon: Cristy Bonner DASEN, MD;  Location: [facility];  Service: Orthopedics;  Laterality: Right;   SHOULDER ARTHROSCOPY WITH SUBACROMIAL DECOMPRESSION, ROTATOR CUFF REPAIR AND BICEP TENDON REPAIR Right 11/07/2023   Procedure: SHOULDER ARTHROSCOPY WITH SUBACROMIAL DECOMPRESSION, ROTATOR CUFF REPAIR AND BICEP TENDON REPAIR;  Surgeon: Cristy Bonner DASEN, MD;  Location: [facility];  Service: Orthopedics;  Laterality: Right;   Family History  Problem Relation Age of Onset   Cancer Father        lung CA   Diabetes Mother    COPD Brother    Drug abuse Brother    Early death Brother    Early death Brother    Social History   Occupational History   Occupation: Retired  Tobacco Use   Smoking status: Never   Smokeless tobacco: Never  Vaping Use   Vaping status: Never Used  Substance and Sexual Activity   Alcohol use: No   Drug use: No   Sexual activity: Not Currently   Tobacco Counseling Counseling given: Not Answered  SDOH Screenings   Food Insecurity: No Food Insecurity (07/28/2024)  Housing: Low Risk  (07/28/2024)  Transportation Needs: No Transportation Needs (07/28/2024)  Utilities: Not At Risk  (07/28/2024)  Alcohol Screen: Low Risk  (07/23/2023)  Depression (PHQ2-9): Low Risk  (07/28/2024)  Financial Resource Strain: Low Risk  (07/25/2024)  Recent Concern: Financial Resource Strain - Medium Risk (06/14/2024)   Received from [facility]  Physical Activity: Sufficiently Active (07/28/2024)  Recent Concern: Physical Activity - Insufficiently Active (07/25/2024)  Social Connections: Moderately Integrated (07/28/2024)  Recent Concern: Social Connections - Moderately Isolated (07/25/2024)  Stress: No Stress Concern Present (07/28/2024)  Tobacco Use: Low Risk  (07/28/2024)  Recent Concern: Tobacco Use - Medium Risk (06/17/2024)   Received from [facility] Literacy: Adequate Health Literacy (07/28/2024)   Depression Screen    07/28/2024    3:18 PM 01/06/2024   10:43 AM 07/23/2023    3:02 PM 03/07/2023   11:29 AM 12/05/2022    8:49 AM 08/06/2022   11:21 AM 08/06/2022   11:02 AM  PHQ 2/9 Scores  PHQ - 2 Score 0 1 0 0 0 0 0     Goals Addressed             This Visit's Progress    Patient Stated       He states he would like to lower his A1c and get off metformin .        Visit info / Clinical Intake: Medicare Wellness Visit Type:: Subsequent Annual Wellness Visit Medicare Wellness Visit Mode:: In-person (required  for WTM) Interpreter Needed?: No Pre-visit prep was completed: yes AWV questionnaire completed by patient prior to visit?: yes Date:: 07/25/24 Living arrangements:: lives with spouse/significant other Patient's Overall Health Status Rating: good Typical amount of pain: none Does pain affect daily life?: no Are you currently prescribed opioids?: no  Dietary Habits and Nutritional Risks How many meals a day?: (!) 1 Eats fruit and vegetables daily?: (!) no Most meals are obtained by: preparing own meals In the last 2 weeks, have you had any of the following?: (!) nausea, vomiting, diarrhea Diabetic:: (!) yes Any non-healing wounds?: no How often do you  check your BS?: continuous glucose monitor Would you like to be referred to a Nutritionist or for Diabetic Management? : no  Functional Status Activities of Daily Living (to include ambulation/medication): Independent Ambulation: Independent Medication Administration: Independent Home Management: Independent Manage your own finances?: yes Primary transportation is: driving Concerns about vision?: no *vision screening is required for WTM* Concerns about hearing?: no  Fall Screening Falls in the past year?: 1 Number of falls in past year: 0 Was there an injury with Fall?: 0 Fall Risk Category Calculator: 1 Patient Fall Risk Level: Low Fall Risk  Fall Risk Patient at Risk for Falls Due to: History of fall(s) Fall risk Follow up: Falls evaluation completed  Home and Transportation Safety: All rugs have non-skid backing?: N/A, no rugs All stairs or steps have railings?: N/A, no stairs Grab bars in the bathtub or shower?: yes Have non-skid surface in bathtub or shower?: (!) no Good home lighting?: yes Regular seat belt use?: yes Hospital stays in the last year:: no  Cognitive Assessment Difficulty concentrating, remembering, or making decisions? : no Will 6CIT or Mini Cog be Completed: yes What year is it?: 0 points What month is it?: 0 points Give patient an address phrase to remember (5 components): [address] [place] [address] About what time is it?: 0 points Count backwards from 20 to 1: 0 points Say the months of the year in reverse: 0 points Repeat the address phrase from earlier: 0 points 6 CIT Score: 0 points  Advance Directives (For Healthcare) Does Patient Have a Medical Advance Directive?: No Would patient like information on creating a medical advance directive?: No - Patient declined  Reviewed/Updated  Reviewed/Updated: Medications; Allergies; Care Teams; Patient Goals        Objective:    Today's Vitals   07/28/24 1458  BP: 137/67  Pulse: 73   SpO2: 99%  Weight: 209 lb (94.8 kg)  Height: 5' 10 (1.778 m)   Body mass index is 29.99 kg/m.  Current Medications (verified) Outpatient Encounter Medications as of 07/28/2024  Medication Sig   clotrimazole -betamethasone  (LOTRISONE ) cream Apply 1 Application topically 2 (two) times daily.   Continuous Glucose Receiver (DEXCOM G7 RECEIVER) DEVI Use for continuous glucose monitoring- patient does not have to pick up if planning to use phone app   Continuous Glucose Sensor (DEXCOM G7 SENSOR) MISC Change sensor every 10 days   Dapagliflozin  Pro-metFORMIN  ER (XIGDUO  XR) 01-999 MG TB24 Take 1 tablet by mouth 2 (two) times daily.   famotidine  (PEPCID ) 40 MG tablet TAKE ONE TABLET BY MOUTH DAILY   fenofibrate  (TRICOR ) 145 MG tablet TAKE ONE TABLET BY MOUTH DAILY   finasteride  (PROSCAR ) 5 MG tablet Take 0.5 tablets (2.5 mg total) by mouth daily.   levothyroxine (SYNTHROID ) 88 MCG tablet TAKE ONE TABLET BY MOUTH DAILY BEFORE BREAKFAST. NEEDS LABS   pantoprazole  (PROTONIX ) 40 MG tablet TAKE ONE  TABLET BY MOUTH DAILY   ramipril  (ALTACE ) 10 MG capsule Take 2 capsules (20 mg total) by mouth daily.   rosuvastatin  (CRESTOR ) 10 MG tablet TAKE ONE TABLET BY MOUTH DAILY   Semaglutide  (RYBELSUS ) 7 MG TABS Take 7 mg by mouth daily.   tadalafil  (CIALIS ) 5 MG tablet TAKE ONE TABLET BY MOUTH DAILY   triamterene -hydrochlorothiazide (MAXZIDE-25) 37.5-25 MG tablet TAKE 1 TABLET BY MOUTH EVERY DAY (Patient taking differently: Take 0.5 tablets by mouth daily.)   naproxen  (NAPROSYN ) 500 MG tablet Take 1 tablet (500 mg total) by mouth 2 (two) times daily with a meal. (Patient not taking: Reported on 07/28/2024)   ondansetron  (ZOFRAN -ODT) 4 MG disintegrating tablet Take 1 tablet (4 mg total) by mouth every 8 (eight) hours as needed for nausea or vomiting. (Patient not taking: Reported on 07/28/2024)   [DISCONTINUED] SPIKEVAX syringe USE AS DIRECTED   No facility-administered encounter medications on file as of  07/28/2024.   Hearing/Vision screen No results found. Immunizations and Health Maintenance Health Maintenance  Topic Date Due   Zoster Vaccines- Shingrix (1 of 2) Never done   Influenza Vaccine  04/24/2024   COVID-19 Vaccine (4 - 2025-26 season) 05/25/2024   HEMOGLOBIN A1C  07/07/2024   Diabetic kidney evaluation - eGFR measurement  01/05/2025   Diabetic kidney evaluation - Urine ACR  01/05/2025   FOOT EXAM  01/05/2025   OPHTHALMOLOGY EXAM  06/16/2025   Medicare Annual Wellness (AWV)  07/28/2025   Fecal DNA (Cologuard)  08/24/2025   DTaP/Tdap/Td (3 - Td or Tdap) 07/06/2026   Pneumococcal Vaccine: 50+ Years  Completed   Hepatitis C Screening  Completed   Meningococcal B Vaccine  Aged Out        Assessment/Plan:  This is a routine wellness examination for Oscar Gallagher.  Patient Care Team: Alvia Bring, DO as PCP - General (Family Medicine) Deanna Channing LABOR, [facility] (Pharmacist) Rosella Lenis, MD as Referring Physician (Pain Medicine) Cristy Bonner DASEN, MD as Consulting Physician (Orthopedic Surgery)  I have personally reviewed and noted the following in the patient's chart:   Medical and social history Use of alcohol, tobacco or illicit drugs  Current medications and supplements including opioid prescriptions. Functional ability and status Nutritional status Physical activity Advanced directives List of other physicians Hospitalizations, surgeries, and ER visits in previous 12 months Vitals Screenings to include cognitive, depression, and falls Referrals and appointments  No orders of the defined types were placed in this encounter.  In addition, I have reviewed and discussed with patient certain preventive protocols, quality metrics, and best practice recommendations. A written personalized care plan for preventive services as well as general preventive health recommendations were provided to patient.   Bonny Jon Gallagher, CMA   07/28/2024   Return in 1 year (on  07/28/2025).  After Visit Summary: (In Person-Declined) Patient declined AVS at this time.  Nurse Notes:   Oscar Gallagher is a 68 y.o. male patient of Alvia Bring, DO who had a Medicare Annual Wellness Visit today. Oscar Gallagher is Retired and lives with their family. He has 1 child. he reports that he is socially active and does interact with friends/family regularly. He is moderately physically active and enjoys wood working, gardening and fishing.

## 2024-07-28 NOTE — Patient Instructions (Signed)
 Mr. Certain,  Thank you for taking the time for your Medicare Wellness Visit. I appreciate your continued commitment to your health goals. Please review the care plan we discussed, and feel free to reach out if I can assist you further.  Please note that Annual Wellness Visits do not include a physical exam. Some assessments may be limited, especially if the visit was conducted virtually. If needed, we may recommend an in-person follow-up with your provider.  Ongoing Care Seeing your primary care provider every 3 to 6 months helps us  monitor your health and provide consistent, personalized care.   Referrals If a referral was made during today's visit and you haven't received any updates within two weeks, please contact the referred provider directly to check on the status.  Recommended Screenings:  Health Maintenance  Topic Date Due   Zoster (Shingles) Vaccine (1 of 2) Never done   Flu Shot  04/24/2024   COVID-19 Vaccine (4 - 2025-26 season) 05/25/2024   Medicare Annual Wellness Visit  07/22/2024   Hemoglobin A1C  07/07/2024   Yearly kidney function blood test for diabetes  01/05/2025   Yearly kidney health urinalysis for diabetes  01/05/2025   Complete foot exam   01/05/2025   Eye exam for diabetics  06/16/2025   Cologuard (Stool DNA test)  08/24/2025   DTaP/Tdap/Td vaccine (3 - Td or Tdap) 07/06/2026   Pneumococcal Vaccine for age over 80  Completed   Hepatitis C Screening  Completed   Meningitis B Vaccine  Aged Out       07/28/2024    3:04 PM  Advanced Directives  Does Patient Have a Medical Advance Directive? No  Would patient like information on creating a medical advance directive? No - Patient declined    Vision: Annual vision screenings are recommended for early detection of glaucoma, cataracts, and diabetic retinopathy. These exams can also reveal signs of chronic conditions such as diabetes and high blood pressure.  Dental: Annual dental screenings help detect early  signs of oral cancer, gum disease, and other conditions linked to overall health, including heart disease and diabetes.

## 2024-08-02 ENCOUNTER — Other Ambulatory Visit: Payer: Self-pay | Admitting: Family Medicine

## 2024-08-05 DIAGNOSIS — M25511 Pain in right shoulder: Secondary | ICD-10-CM | POA: Diagnosis not present

## 2024-08-06 ENCOUNTER — Telehealth: Payer: Self-pay | Admitting: Pharmacy Technician

## 2024-08-06 NOTE — Progress Notes (Signed)
   08/06/2024  Patient ID: Oscar Gallagher, male   DOB: [birth year], 68 y.o.   MRN: [id number]  Pharmacy Quality Measure Review  This patient is appearing on a report for being at risk of failing the adherence measure for diabetes medications this calendar year.   Medication: Xigduo  XR Last fill date: 08/06/2024 for 30 day supply  Contacted pharmacy to facilitate refills., Contacted patient and inform prescription will be called in and to call them to find out when it would be ready, and Will collaborate with embedded pharmacist to inquire about PAP with [facility] b/c per check of their website, Xigduo  is still offered. Urijah Raynor, CPhT [facility] Office: [phone] Email: [email or web address]

## 2024-08-07 ENCOUNTER — Telehealth: Payer: Self-pay

## 2024-08-07 NOTE — Progress Notes (Signed)
   08/07/2024  Patient ID: Oscar Gallagher, male   DOB: 25-Mar-1956, 68 y.o.   MRN: [id number]  Patient appearing on quality report for being at risk of failing adherence measure for Xigduo  XR 5/1000mg  BID.  Patient previously receiving this through [facility] PAP but was informed earlier this year that the medication was no longer available, so he did fill a 30 day prescription at [facility] 9/24.  I was recently informed that this product IS still available through [employer] and called to confirm this.  We will need to submit a new application to the program, so I am sending the patient a MyChart message to see if he would like this process initiated.  [name], PharmD, DPLA

## 2024-08-18 ENCOUNTER — Other Ambulatory Visit: Payer: Medicare (Managed Care)

## 2024-08-18 DIAGNOSIS — E119 Type 2 diabetes mellitus without complications: Secondary | ICD-10-CM

## 2024-08-18 NOTE — Progress Notes (Signed)
   08/18/24  Patient ID: Oscar Gallagher, male   DOB: 1956/09/02, 68 y.o.   MRN: [id number]  Subjective/objective Telephone visit to follow-up on management of type 2 diabetes   Diabetes management plan -Current medications: Rybelsus  7 mg daily, Xigduo  XR 01/999 mg 1 tablet twice daily -Patient receives Rybelsus  through [employer] PAP and is tolerating the 7mg  dose well.  He did not tolerate the 14mg  dose well in the past.  -Patient was receiving Xigduo  through [employer] PAP, but we were (incorrectly) informed that this medication was no longer available.  He has been refilling the medication through his pharmacy for $47, which is expensive. -Using Dexcom G7 for CGM and endorses FBG 100-120, but this will go up over  200 after eating sometimes -Patient has an appointment 12/1 for diabetes nutrition education -A1c 4/14 was 6.6%  -Does not endorse any signs or symptoms of hypo or hyperglycemia -ACEi/ARB for cardiorenal protection:  ramipril  20mg  daily; last OV BP was 137/67 on 11/4, but this is usually wnl -UACR of 4 >1 year ago -Statin on board for ASCVD reduction:  rosuvastatin  10mg  daily; LDL 64 in April   Assessment/plan   Diabetes management plan -Controlled based on last A1c, but patient does seem to be experiencing post-prandial hyperglycemia, likely related to dietary choices -UACR and LDL at goal; BP typically at goal -Keep nutrition appointment scheduled for 12/1 -Continue Rybelsus  7mg  daily and Xigduo  XR 5/1000mg  BID at this time -Patient has 2-3 months remaining of Rybelsus  7mg  daily -Applied for LIS Medicare Extra Help today; if approved, this would make copay for Rybelsus  $12 on Medicare -Coordinating with medication assistance team to help with PAP application for Xigduo  XR 5/1000mg  BID, so he can hopefully resume getting this medication at no cost moving forward -Continue using Dexcom G7 for CGM -Sees PCP again  12/17 and will be due for A1c, UACR, lipid panel, and CMP  Follow up:   1/5 to follow-up on diabetes control and see if patient has received communication from [facility] in regard to LIS application   Oscar Gallagher, PharmD, DPLA

## 2024-08-19 ENCOUNTER — Telehealth: Payer: Self-pay

## 2024-08-19 NOTE — Telephone Encounter (Signed)
 PAP: Patient assistance application for Xigduo  XR through [employer] ([employer]) has been mailed to pt's home address on file. Provider portion of application will be faxed to provider's office.

## 2024-08-19 NOTE — Telephone Encounter (Signed)
 Received Provider portion PAP application [employer] for Xigduo  XR

## 2024-08-24 ENCOUNTER — Encounter: Payer: Self-pay | Admitting: Dietician

## 2024-08-24 ENCOUNTER — Encounter: Payer: Medicare (Managed Care) | Admitting: Dietician

## 2024-08-24 DIAGNOSIS — Z794 Long term (current) use of insulin: Secondary | ICD-10-CM | POA: Insufficient documentation

## 2024-08-24 DIAGNOSIS — E119 Type 2 diabetes mellitus without complications: Secondary | ICD-10-CM | POA: Diagnosis not present

## 2024-08-24 NOTE — Patient Instructions (Addendum)
 Try Whipped cream cheese instead of regular cream cheese for a lower fat option.  Choose a vinaigrette instead of creamy salad dressings ([place]anch[address]), choose reduced fat Cream of Mushroom/Chicken soups.  Consider Fairlife brand chocolate milk in place of regular milk and Nesquick for lower sugar, higher protein option. Stick to no more than an 8 oz portion.  Look for a rise of 40-50 points in glucose at most after a meal!  Download the Dexcom Clarity app!  Desired Ranges on GCM Report: Very Low (below 54 mg/dL): <8% Low (below 70 mg/dL): <5% Time in Range (70 - 180 mg/dL): >29% High (819 - 749 mg/dL): <74% Very High (above 250 mg/dL): <4%

## 2024-08-24 NOTE — Progress Notes (Signed)
 Diabetes Self-Management Education  Visit Type: First/Initial  Appt. Start Time: 1455  Appt. End Time: 1600  08/24/2024  Mr. [facility], identified by name and date of birth, is a 68 y.o. male with a diagnosis of Diabetes: Type 2.   ASSESSMENT Pt reports taking Xigduo  XR 01/999 BID, reports diarrhea 2-3 times a week, has reduced dose over last few days to 1 in the morning and has not had diarrhea since. Pt reports taking Rybelsus  @7  mg daily, taking as directed, reports suppressed appetite at 7 mg, was unable to tolerate 14 mg.  Pt reports financial hardship that had made affording medications difficult before, reports using financial assistance programs to make meds affordable again. 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 Pt reports eating 3 meals daily, wife cooks on a regular basis, pt reports cooking occasionally as well. Pt states they like a lot of different vegetables.   Diabetes Self-Management Education - 08/22/24 2053       Visit Information   Visit Type First/Initial      Initial Visit   Diabetes Type Type 2    Date Diagnosed 2019      Psychosocial Assessment   Patient Belief/Attitude about Diabetes Defeat/Burnout    What is the hardest part about your diabetes right now, causing you the most concern, or is the most worrisome to you about your diabetes?   Taking/obtaining medications;Making healty food and beverage choices    Self-care barriers  None    Self-management support Doctor's office    Other persons present Patient    Patient Concerns Nutrition/Meal planning;Glycemic Control    Special Needs None    Preferred Learning Style No preference indicated    Learning Readiness Contemplating    How often do you need to have someone help you when you read instructions, pamphlets, or other written materials from your doctor or pharmacy? 1 - Never      Pre-Education Assessment   Patient understands the diabetes disease and treatment process. Needs Instruction    Patient understands incorporating nutritional management into lifestyle. Needs Instruction    Patient undertands incorporating physical activity into lifestyle. Needs Instruction    Patient understands using medications safely. Needs Instruction    Patient understands monitoring blood glucose, interpreting and using results Needs Instruction    Patient understands prevention, detection, and treatment of acute complications. Needs Instruction    Patient understands prevention, detection, and treatment of chronic complications. Needs Instruction    Patient understands how to develop strategies to address psychosocial issues. Needs Instruction    Patient understands how to develop strategies to promote health/change behavior. Needs Instruction      Complications   Last HgB A1C per patient/outside source 6.6 %   01/06/2024   How often do  you check your blood sugar? > 4 times/day   CGM   Fasting Blood glucose range (mg/dL) 29-870;869-820    Postprandial Blood glucose range (mg/dL) 819-799;869-820    Number of hyperglycemic episodes ( >200mg /dL): Rare    Can you tell when your blood sugar is high? No    Are you checking your feet? Yes    How many days per week are you checking your feet? 7      Dietary Intake   Breakfast Strawberry Breakfast bar, 1.5 cups of coffee w/ french vanilla creamer    Lunch Ruby Tuesdays seafood pasta (shrimp, scallops, crab meat, lobster sauce), 1/2  slice bread, water w/ lemon    Snack (afternoon) ZERO sugar Mtn Dew    Dinner Ruby Tuesdays seafood pasta, 2% chocolate milk    Snack (evening) Banana pudding    Beverage(s) Water, ZERO Mtn Dew, Coffee, Chocolate Milk      Activity / Exercise   Activity / Exercise Type ADL's      Patient Education   Disease Pathophysiology Factors that contribute to the development of diabetes    Healthy Eating Food label reading, portion sizes and measuring food.;Plate Method;Reviewed blood glucose goals for pre and post meals and how to evaluate the patients' food intake on their blood glucose level.;Meal options for control of blood glucose level and chronic complications.    Medications Reviewed patients medication for diabetes, action, purpose, timing of dose and side effects.    Monitoring Taught/evaluated CGM (comment)    Chronic complications Relationship between chronic complications and blood glucose control    Diabetes Stress and Support Identified and addressed patients feelings and concerns about diabetes      Individualized Goals (developed by patient)   Nutrition Follow meal plan discussed    Physical Activity Not Applicable    Medications take my medication as prescribed    Monitoring  Consistenly use CGM    Health Coping Ask for help with psychological, social, or emotional issues      Post-Education Assessment   Patient understands the diabetes disease and treatment process. Needs Review    Patient understands incorporating nutritional management into lifestyle. Needs Review    Patient undertands incorporating physical activity into lifestyle. Needs Review    Patient understands using medications safely. Comphrehends key points    Patient understands monitoring blood glucose, interpreting and using results Comprehends key points    Patient understands prevention, detection, and treatment of acute complications. Needs Review    Patient understands prevention, detection, and treatment  of chronic complications. Needs Review    Patient understands how to develop strategies to address psychosocial issues. Needs Review    Patient understands how to develop strategies to promote health/change behavior. Needs Review      Outcomes   Expected Outcomes Demonstrated interest in learning but significant barriers to change    Future DMSE PRN    Program Status Not Completed          Individualized Plan for Diabetes Self-Management Training:   Learning Objective:  Patient will have a greater understanding of diabetes self-management. Patient education plan is to attend individual and/or group sessions per assessed needs and concerns.   Plan:   Patient Instructions  Try Whipped cream cheese instead of regular cream cheese for a lower fat option.  Choose a vinaigrette instead of creamy salad dressings ([place]), choose reduced fat Cream of Mushroom/Chicken soups.  Consider Fairlife brand chocolate milk in place of regular milk and Nesquick for  lower sugar, higher protein option. Stick to no more than an 8 oz portion.  Look for a rise of 40-50 points in glucose at most after a meal!  Download the Dexcom Clarity app!  Desired Ranges on GCM Report: Very Low (below 54 mg/dL): <8% Low (below 70 mg/dL): <5% Time in Range (70 - 180 mg/dL): >29% High (819 - 749 mg/dL): <74% Very High (above 250 mg/dL): <4%       Expected Outcomes:  Demonstrated interest in learning but significant barriers to change  Education material provided: My Plate, Food list, CGM Overview, CGM and Nutrition Conversation  If problems or questions, patient to contact team via:  Phone and Email  Future DSME appointment: PRN

## 2024-08-26 DIAGNOSIS — M47816 Spondylosis without myelopathy or radiculopathy, lumbar region: Secondary | ICD-10-CM | POA: Diagnosis not present

## 2024-08-26 DIAGNOSIS — M48061 Spinal stenosis, lumbar region without neurogenic claudication: Secondary | ICD-10-CM | POA: Diagnosis not present

## 2024-08-26 DIAGNOSIS — M4726 Other spondylosis with radiculopathy, lumbar region: Secondary | ICD-10-CM | POA: Diagnosis not present

## 2024-08-26 DIAGNOSIS — G894 Chronic pain syndrome: Secondary | ICD-10-CM | POA: Diagnosis not present

## 2024-09-04 ENCOUNTER — Telehealth: Payer: Self-pay

## 2024-09-04 NOTE — Telephone Encounter (Signed)
 PAP: Application for Davonna Belling has been submitted to [employer] ([employer]Me), via fax

## 2024-09-04 NOTE — Progress Notes (Signed)
° °  09/04/2024  Patient ID: Oscar Gallagher, male   DOB: [birth year], 68 y.o.   MRN: [id number]  This patient is appearing on a report for being at risk of failing the adherence measure for hypertension (ACEi/ARB) medications this calendar year.   Medication: ramipril  10mg   Last fill date: 08/26/24 for 90 day supply  Insurance report was not up to date. No action needed at this time.   [name], PharmD, DPLA

## 2024-09-08 NOTE — Telephone Encounter (Signed)
 PAP: Patient assistance application for Xigduo  has been approved by PAP Companies: [employer] from 09/08/2024 to 09/23/2025. Medication should be delivered to PAP Delivery: Home. For further shipping updates, please contact [employer] ([employer]) at [phone]. Patient ID is: [id number]

## 2024-09-09 ENCOUNTER — Ambulatory Visit: Payer: Medicare (Managed Care) | Admitting: Family Medicine

## 2024-09-09 ENCOUNTER — Encounter: Payer: Self-pay | Admitting: Family Medicine

## 2024-09-09 VITALS — BP 130/72 | HR 84 | Ht 70.0 in | Wt 210.0 lb

## 2024-09-09 DIAGNOSIS — I10 Essential (primary) hypertension: Secondary | ICD-10-CM | POA: Diagnosis not present

## 2024-09-09 DIAGNOSIS — E039 Hypothyroidism, unspecified: Secondary | ICD-10-CM

## 2024-09-09 DIAGNOSIS — N529 Male erectile dysfunction, unspecified: Secondary | ICD-10-CM | POA: Diagnosis not present

## 2024-09-09 DIAGNOSIS — E1169 Type 2 diabetes mellitus with other specified complication: Secondary | ICD-10-CM

## 2024-09-09 DIAGNOSIS — Z794 Long term (current) use of insulin: Secondary | ICD-10-CM

## 2024-09-09 DIAGNOSIS — E785 Hyperlipidemia, unspecified: Secondary | ICD-10-CM

## 2024-09-09 DIAGNOSIS — L578 Other skin changes due to chronic exposure to nonionizing radiation: Secondary | ICD-10-CM

## 2024-09-09 DIAGNOSIS — E119 Type 2 diabetes mellitus without complications: Secondary | ICD-10-CM | POA: Diagnosis not present

## 2024-09-09 LAB — POCT GLYCOSYLATED HEMOGLOBIN (HGB A1C): HbA1c, POC (controlled diabetic range): 6.3 % (ref 0.0–7.0)

## 2024-09-09 NOTE — Progress Notes (Unsigned)
 Oscar Gallagher - 69 y.o. male MRN [id number]  Date of birth: [birth year]  Subjective Chief Complaint  Patient presents with   Diabetes   Hypertension    HPI Oscar Gallagher is a 68 y.o. male here today for follow up visit.   He reports that he is ***.   He remains on rybelsus  for management of T2DM.  Tolerating well at current strength.  A1c today is ***.   Doing well with antihypertensives Allergies[1]  Past Medical History:  Diagnosis Date   Anemia    Taking Vitamin D   Arthritis    Asthma    Diabetes mellitus without complication (HCC)    GERD (gastroesophageal reflux disease)    Hyperlipidemia    Hypertension    Stroke [facility])    Thyroid  disease    Ulcer    Well adult exam 03/10/2024    Past Surgical History:  Procedure Laterality Date   CARPAL TUNNEL RELEASE Right 02/10/2019   ELBOW FRACTURE SURGERY Left    Prosthesis on radial bone   HERNIA REPAIR     JOINT REPLACEMENT     SHOULDER ARTHROSCOPY WITH DISTAL CLAVICLE RESECTION Right 11/07/2023   Procedure: SHOULDER ARTHROSCOPY WITH DISTAL CLAVICLE EXCISION;  Surgeon: Cristy Bonner DASEN, MD;  Location: [facility];  Service: Orthopedics;  Laterality: Right;   SHOULDER ARTHROSCOPY WITH SUBACROMIAL DECOMPRESSION, ROTATOR CUFF REPAIR AND BICEP TENDON REPAIR Right 11/07/2023   Procedure: SHOULDER ARTHROSCOPY WITH SUBACROMIAL DECOMPRESSION, ROTATOR CUFF REPAIR AND BICEP TENDON REPAIR;  Surgeon: Cristy Bonner DASEN, MD;  Location: [facility];  Service: Orthopedics;  Laterality: Right;    Social History   Socioeconomic History   Marital status: Married    Spouse name: Ginnie   Number of children: 1   Years of education: 12   Highest education level: Some college, no degree  Occupational History   Occupation: Retired  Tobacco Use   Smoking status: Never   Smokeless tobacco: Never  Vaping Use   Vaping status: Never Used  Substance and Sexual Activity   Alcohol use: No    Drug use: No   Sexual activity: Not Currently  Other Topics Concern   Not on file  Social History Narrative   Lives with wife, daughter and grand-daughter. He enjoys wood working, gardening and fishing.   Social Drivers of Health   Tobacco Use: Medium Risk (08/31/2024)   Received from [facility]   Patient History    Smoking Tobacco Use: Never    Smokeless Tobacco Use: Former    Passive Exposure: Not on Actuary Strain: Low Risk (07/25/2024)   Overall Financial Resource Strain (CARDIA)    Difficulty of Paying Living Expenses: Not very hard  Recent Concern: Financial Resource Strain - Medium Risk (06/14/2024)   Received from [facility]   Overall Financial Resource Strain (CARDIA)    How hard is it for you to pay for the very basics like food, housing, medical care, and heating?: Somewhat hard  Food Insecurity: No Food Insecurity (07/28/2024)   Epic    Worried About Programme Researcher, Broadcasting/film/video in the Last Year: Never true    Ran Out of Food in the Last Year: Never true  Transportation Needs: No Transportation Needs (07/28/2024)   Epic    Lack of Transportation (Medical): No    Lack of Transportation (Non-Medical): No  Physical Activity: Sufficiently Active (07/28/2024)   Exercise Vital Sign    Days of Exercise per Week: 7 days  Minutes of Exercise per Session: 30 min  Recent Concern: Physical Activity - Insufficiently Active (07/25/2024)   Exercise Vital Sign    Days of Exercise per Week: 1 day    Minutes of Exercise per Session: 20 min  Stress: No Stress Concern Present (07/28/2024)   [employer] of Occupational Health - Occupational Stress Questionnaire    Feeling of Stress: Not at all  Social Connections: Moderately Integrated (07/28/2024)   Social Connection and Isolation Panel    Frequency of Communication with Friends and Family: More than three times a week    Frequency of Social Gatherings with Friends and Family: More than three times a  week    Attends Religious Services: More than 4 times per year    Active Member of [employer] or Organizations: No    Attends Banker Meetings: Never    Marital Status: Married  Recent Concern: Social Connections - Moderately Isolated (07/25/2024)   Social Connection and Isolation Panel    Frequency of Communication with Friends and Family: More than three times a week    Frequency of Social Gatherings with Friends and Family: More than three times a week    Attends Religious Services: Patient declined    Active Member of Clubs or Organizations: No    Attends Engineer, Structural: Not on file    Marital Status: Married  Depression (PHQ2-9): Low Risk (08/24/2024)   Depression (PHQ2-9)    PHQ-2 Score: 0  Alcohol Screen: Low Risk (07/23/2023)   Alcohol Screen    Last Alcohol Screening Score (AUDIT): 0  Housing: Low Risk (07/28/2024)   Epic    Unable to Pay for Housing in the Last Year: No    Number of Times Moved in the Last Year: 0    Homeless in the Last Year: No  Utilities: Not At Risk (07/28/2024)   Epic    Threatened with loss of utilities: No  Health Literacy: Adequate Health Literacy (07/28/2024)   B1300 Health Literacy    Frequency of need for help with medical instructions: Never    Family History  Problem Relation Age of Onset   Cancer Father        lung CA   Diabetes Mother    COPD Brother    Drug abuse Brother    Early death Brother    Early death Brother     Health Maintenance  Topic Date Due   Zoster Vaccines- Shingrix (1 of 2) Never done   HEMOGLOBIN A1C  07/07/2024   COVID-19 Vaccine (4 - 2025-26 season) 09/25/2024 (Originally 05/25/2024)   Diabetic kidney evaluation - eGFR measurement  01/05/2025   Diabetic kidney evaluation - Urine ACR  01/05/2025   FOOT EXAM  01/05/2025   OPHTHALMOLOGY EXAM  06/16/2025   Medicare Annual Wellness (AWV)  07/28/2025   Fecal DNA (Cologuard)  08/24/2025   DTaP/Tdap/Td (3 - Td  or Tdap) 07/06/2026   Pneumococcal Vaccine: 50+ Years  Completed   Influenza Vaccine  Completed   Hepatitis C Screening  Completed   Meningococcal B Vaccine  Aged Out     ----------------------------------------------------------------------------------------------------------------------------------------------------------------------------------------------------------------- Physical Exam There were no vitals taken for this visit.  Physical Exam  ------------------------------------------------------------------------------------------------------------------------------------------------------------------------------------------------------------------- Assessment and Plan  No problem-specific Assessment & Plan notes found for this encounter.   No orders of the defined types were placed in this encounter.   No follow-ups on file.          [1] Allergies Allergen Reactions   Aspirin Other (See  Comments)    Reports a history of Peptic Ulcer Disease

## 2024-09-10 ENCOUNTER — Encounter: Payer: Self-pay | Admitting: Family Medicine

## 2024-09-10 DIAGNOSIS — M48061 Spinal stenosis, lumbar region without neurogenic claudication: Secondary | ICD-10-CM | POA: Diagnosis not present

## 2024-09-10 DIAGNOSIS — M4726 Other spondylosis with radiculopathy, lumbar region: Secondary | ICD-10-CM | POA: Diagnosis not present

## 2024-09-10 MED ORDER — SILDENAFIL CITRATE 20 MG PO TABS: 20.0000 mg | ORAL_TABLET | Freq: Every day | ORAL | 3 refills | Status: AC | PRN

## 2024-09-13 NOTE — Assessment & Plan Note (Signed)
 He is on low strength tadalafil  for BPH symptoms however not a whole lot of improvement with his ED.  Will add sildenafil  20 mg which he can titrate from 20 to 100 mg as needed based on response.

## 2024-09-13 NOTE — Assessment & Plan Note (Signed)
 Followed by dermatology.  Has small area on left cheek that appears unchanged.  Will continue to monitor.

## 2024-09-13 NOTE — Assessment & Plan Note (Signed)
 Blood sugars are better controlled.  Continue xigudo BID with ryblesus at 7mg .  F/u in 6 months.

## 2024-09-13 NOTE — Assessment & Plan Note (Signed)
 Blood pressure remains fairly well-controlled.  Continue current medications for management of hypertension.

## 2024-09-13 NOTE — Assessment & Plan Note (Signed)
Doing well with current strength of levothyroxine.  Recommend continuation.

## 2024-09-13 NOTE — Assessment & Plan Note (Signed)
 Doing well with Crestor, continue current strength. Update lipids

## 2024-09-22 ENCOUNTER — Other Ambulatory Visit: Payer: Self-pay | Admitting: Family Medicine

## 2024-09-25 NOTE — Progress Notes (Signed)
" ° °  09/28/24  Patient ID: Oscar Gallagher, male   DOB: 1955/11/18, 69 y.o.   MRN: [id number]  Subjective/objective Telephone visit to follow-up on management of type 2 diabetes   Diabetes management plan -Current medications: Rybelsus  7 mg daily, Xigduo  XR 01/999 mg 1 tablet twice daily -Patient was receiving Rybelsus  7mg  through [employer] PAP, but he will not receive anymore medication through program based on 2026 changes.  He still has 2 unopened boxes of medication remaining (approximately 2 month supply on hand_ -Patient has been approved to resume receiving Xigduo  XR 5/1000mg  through [employer] and recently received a 90 day supply.  Medication is prescribed to take 1 tablet BID, but he will just take once daily at times due to GI upset. -Using Dexcom G7 for CGM and endorses being in range 88-95% of the time -Patient had recent appointment for diabetes nutrition education -Does not endorse any signs or symptoms of hypo or hyperglycemia -ACEi/ARB for cardiorenal protection:  ramipril  20mg  daily; last OV BP was 130/72 -UACR of 30 December 2023 -Statin on board for ASCVD reduction:  rosuvastatin  10mg  daily  Toenail Fungus -Current medications:  OTC nail lacquer -Patient states he has tried a couple of OTC antifungals over the past year that have not been effective -Inquiring about terbinafine prescription  Lab Results  Component Value Date   HGBA1C 6.3 09/09/2024   HGBA1C 6.6 01/06/2024   HGBA1C 7.6 (A) 07/08/2023       Component Value Date/Time   NA 137 01/06/2024 1104   K 4.4 01/06/2024 1104   CL 100 01/06/2024 1104   CO2 23 01/06/2024 1104   GLUCOSE 92 01/06/2024 1104   GLUCOSE 180 (H) 11/05/2023 1400   BUN 23 01/06/2024 1104   CREATININE 1.21 01/06/2024 1104   CREATININE 1.23 12/05/2022 0932   CALCIUM  9.7 01/06/2024 1104   PROT 6.2 01/06/2024 1104   ALBUMIN 4.4 01/06/2024 1104   AST 26 01/06/2024 1104   ALT 15 01/06/2024 1104   ALKPHOS 38 (L) 01/06/2024 1104   BILITOT 0.5 01/06/2024  1104   EGFR 66 01/06/2024 1104   GFRNONAA >60 11/05/2023 1400       Component Value Date/Time   CHOL 125 01/06/2024 1104   TRIG 135 01/06/2024 1104   HDL 37 (L) 01/06/2024 1104   CHOLHDL 4.2 12/05/2022 0932   LDLCALC 64 01/06/2024 1104   LDLCALC 75 12/05/2022 0932   LABVLDL 24 01/06/2024 1104    Assessment/plan   Diabetes management plan -A1c, UACR, LDL, and BP at goal -Continue Rybelsus  7mg  daily and Xigduo  XR 5/1000mg  BID at this time -Applied for LIS Medicare Extra Help during last visit; patient has not received response from [facility] yet.  If approved, this would make copay for Rybelsus  $12 on Medicare for up to a 90 day supply. -Continue using Dexcom G7 for CGM -Sees PCP again in April and will be due for A1c, UACR, lipid panel, and CMP  Toenail Fungus -Consulting PCP in regard to initiating terbinafine 250mg  daily x12 weeks.  Advised patient that I would recommend CMP to evaluate liver function prior to initiating therapy since last was in April of 2025.  If LFT's within normal limits, therapy would be appropriate, and I recommend follow-up CMP in 4 weeks and again at April PCP visit.  Follow up:  3 weeks to see if patient rec'd response from LIS application   [name], PharmD, DPLA  "

## 2024-09-28 ENCOUNTER — Other Ambulatory Visit: Payer: Medicare (Managed Care)

## 2024-09-28 DIAGNOSIS — I10 Essential (primary) hypertension: Secondary | ICD-10-CM

## 2024-09-28 DIAGNOSIS — E1169 Type 2 diabetes mellitus with other specified complication: Secondary | ICD-10-CM

## 2024-09-28 DIAGNOSIS — E119 Type 2 diabetes mellitus without complications: Secondary | ICD-10-CM

## 2024-09-28 MED ORDER — TRIAMCINOLONE ACETONIDE 0.1 % EX CREA: 1.0000 | TOPICAL_CREAM | Freq: Two times a day (BID) | CUTANEOUS | 1 refills | Status: AC

## 2024-09-28 NOTE — Addendum Note (Signed)
 Addended by: Zykia Walla E on: 09/28/2024 12:44 PM   Modules accepted: Orders

## 2024-09-29 ENCOUNTER — Other Ambulatory Visit: Payer: Self-pay | Admitting: Family Medicine

## 2024-09-29 NOTE — Telephone Encounter (Unsigned)
 Copied from CRM [id number]. Topic: Clinical - Medication Refill >> Sep 29, 2024  4:28 PM Mercer PEDLAR wrote: Medication: Continuous Glucose Sensor (DEXCOM G7 SENSOR) MISC Patient is requesting for the 15-day sensor if possible.  Has the patient contacted their pharmacy? Yes (Agent: If no, request that the patient contact the pharmacy for the refill. If patient does not wish to contact the pharmacy document the reason why and proceed with request.) (Agent: If yes, when and what did the pharmacy advise?)  This is the patient's preferred pharmacy:  [facility] [place], [place] - [address] [place] [address] [place] [address] Phone: [phone] Fax: [phone]  Is this the correct pharmacy for this prescription? Yes If no, delete pharmacy and type the correct one.   Has the prescription been filled recently? No  Is the patient out of the medication? Yes  Has the patient been seen for an appointment in the last year OR does the patient have an upcoming appointment? Yes  Can we respond through MyChart? Yes  Agent: Please be advised that Rx refills may take up to 3 business days. We ask that you follow-up with your pharmacy.

## 2024-09-30 MED ORDER — DEXCOM G7 SENSOR MISC: 11 refills | Status: DC

## 2024-10-02 MED ORDER — TERBINAFINE HCL 250 MG PO TABS: 250.0000 mg | ORAL_TABLET | Freq: Every day | ORAL | 0 refills | Status: AC

## 2024-10-02 NOTE — Addendum Note (Signed)
 Addended by: Amarea Macdowell E on: 10/02/2024 01:53 PM   Modules accepted: Orders

## 2024-10-02 NOTE — Addendum Note (Signed)
 Addended by: Dilyn Smiles E on: 10/02/2024 01:54 PM   Modules accepted: Orders

## 2024-10-05 ENCOUNTER — Other Ambulatory Visit: Payer: Self-pay | Admitting: Family Medicine

## 2024-10-05 ENCOUNTER — Other Ambulatory Visit (HOSPITAL_COMMUNITY): Payer: Self-pay

## 2024-10-05 ENCOUNTER — Telehealth: Payer: Self-pay

## 2024-10-05 ENCOUNTER — Telehealth: Payer: Self-pay | Admitting: Family Medicine

## 2024-10-05 DIAGNOSIS — I10 Essential (primary) hypertension: Secondary | ICD-10-CM

## 2024-10-05 DIAGNOSIS — K219 Gastro-esophageal reflux disease without esophagitis: Secondary | ICD-10-CM

## 2024-10-05 NOTE — Telephone Encounter (Signed)
 Pharmacy Patient Advocate Encounter   Received notification from Physician's Office that prior authorization for [facility] is required/requested.   Insurance verification completed.   The patient is insured through [employer].   Per test claim:  ACCU-CHEK OR TRUE METRIX is preferred by the insurance.  If suggested medication is appropriate, Please send in a new RX and discontinue this one. If not, please advise as to why it's not appropriate so that we may request a Prior Authorization. Please note, some preferred medications may still require a PA.  If the suggested medications have not been trialed and there are no contraindications to their use, the PA will not be submitted, as it will not be approved.

## 2024-10-05 NOTE — Telephone Encounter (Signed)
 Spoke with the patient and he states this is the last day for the sensor he has on and would like to know his next steps.

## 2024-10-05 NOTE — Telephone Encounter (Unsigned)
 Copied from CRM [id number]. Topic: Clinical - Medication Prior Auth >> Oct 02, 2024  5:58 PM Kevelyn M wrote: Reason for CRM: Sasha with [facility] calling to let provider know that the [facility] prescription denied needs a PA.  Call back # [phone]

## 2024-10-05 NOTE — Telephone Encounter (Signed)
 Copied from CRM [id number]. Topic: Clinical - Prescription Issue >> Oct 05, 2024 11:59 AM Mercer PEDLAR wrote: Reason for CRM: Patient is requesting a callback regarding Continuous Glucose Sensor (DEXCOM G7 SENSOR) MISC. Patient was frustrated and did not provide further explanation.

## 2024-10-06 NOTE — Telephone Encounter (Signed)
 Plan prefers Accu-chek or true metrix.

## 2024-10-07 NOTE — Telephone Encounter (Signed)
 Patient was informed that CGM is not going to be covered and he states he does have the traditional glucometer but feels this is not accurate and requires the painful finger sticks. He is going to reach out to his insurance regarding this and will let us  know what we can do to help furhter.

## 2024-10-08 NOTE — Telephone Encounter (Signed)
 Copied from CRM [id number]. Topic: Clinical - Medication Prior Auth >> Oct 08, 2024  1:15 PM Winona SAUNDERS wrote: Ronnald from [facility] calling to request a PA for  [facility] G7 SENSOR. >> Oct 08, 2024  1:24 PM Antony RAMAN wrote: Patient called back and said this is covered through his medicare, proceed to get this for patient. Advise when it get ordered [phone]

## 2024-10-08 NOTE — Telephone Encounter (Signed)
 This task has been completed as requested. Please review other TE for additional information. No further action needed.

## 2024-10-12 ENCOUNTER — Telehealth: Payer: Self-pay

## 2024-10-12 DIAGNOSIS — E119 Type 2 diabetes mellitus without complications: Secondary | ICD-10-CM

## 2024-10-12 NOTE — Progress Notes (Signed)
 "  10/12/24  Patient ID: Oscar Gallagher, male   DOB: 1956/03/23, 69 y.o.   MRN: [id number]  Subjective/objective Returning missed call/voicemail from patient in regard to issue filling Dexcom G7 sensors   Diabetes management plan -Current medications: Rybelsus  7 mg daily, Xigduo  XR 01/999 mg 1 tablet twice daily -Patient was receiving Rybelsus  7mg  through [employer] [facility], but he will not receive anymore medication through program based on 2026 changes.  He still has 2 approximately a 2 month supply of medication remaining. -Patient has been approved to resume receiving Xigduo  XR 5/1000mg  through [employer] and recently received a 90 day supply.  Medication is prescribed to take 1 tablet BID, but he will just take once daily at times due to GI upset. 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  He is trying another OTC topical first to see if he sees improvement with it.  Lab Results  Component Value Date   HGBA1C 6.3 09/09/2024   HGBA1C 6.6 01/06/2024   HGBA1C 7.6 (A) 07/08/2023       Component Value Date/Time   NA 137 01/06/2024 1104   K 4.4 01/06/2024 1104   CL 100 01/06/2024 1104   CO2 23 01/06/2024 1104   GLUCOSE 92 01/06/2024 1104   GLUCOSE 180 (H) 11/05/2023 1400   BUN 23 01/06/2024 1104   CREATININE 1.21 01/06/2024 1104   CREATININE 1.23 12/05/2022 0932   CALCIUM  9.7 01/06/2024 1104   PROT 6.2 01/06/2024 1104   ALBUMIN 4.4 01/06/2024 1104   AST 26 01/06/2024 1104   ALT 15 01/06/2024 1104   ALKPHOS 38 (L) 01/06/2024 1104   BILITOT 0.5 01/06/2024 1104   EGFR 66 01/06/2024  1104   GFRNONAA >60 11/05/2023 1400       Component Value Date/Time   CHOL 125 01/06/2024 1104   TRIG 135 01/06/2024 1104   HDL 37 (L) 01/06/2024 1104   CHOLHDL 4.2 12/05/2022 0932   LDLCALC 64 01/06/2024 1104   LDLCALC 75 12/05/2022 0932   LABVLDL 24 01/06/2024 1104    Assessment/plan   Diabetes management plan -A1c, UACR, LDL, and BP at goal -Continue Rybelsus  7mg  daily and Xigduo  XR 5/1000mg  BID at this time -Applied for [employer] Medicare Extra Help during last visit, but this was denied -Test claim for Rybelsus  reflects 1st fill would be $297 due to $250 deductible, and then he would pay $47/month.  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

## 2024-10-13 ENCOUNTER — Other Ambulatory Visit: Payer: Self-pay | Admitting: Family Medicine

## 2024-10-14 ENCOUNTER — Other Ambulatory Visit: Payer: Self-pay

## 2024-10-14 DIAGNOSIS — E119 Type 2 diabetes mellitus without complications: Secondary | ICD-10-CM

## 2024-10-14 MED ORDER — DEXCOM G7 SENSOR MISC: 0 refills | Status: AC

## 2024-10-14 NOTE — Progress Notes (Signed)
" ° °  10/14/2024  Patient ID: Oscar Gallagher, male   DOB: November 25, 1955, 69 y.o.   MRN: [id number]  Insurance has denied PA for Dexcom G7 sensors since patient is not using insulin, but they will cover 1 more 90 day refill as continuation of care.  Order sent to pharmacy for 90 day refill.  It also appears that a refill for Xigduo  was sent to the patient's pharmacy today, but he receives this through [employer] PAP.  Sent patient a message to notify about Dexcom and see if he needs us  to contact [employer] for a Xigduo  refill, so he does not have to pay for this at the pharmacy.    [name], PharmD, DPLA  "

## 2024-10-18 ENCOUNTER — Other Ambulatory Visit: Payer: Self-pay | Admitting: Family Medicine

## 2024-10-19 ENCOUNTER — Other Ambulatory Visit: Payer: Self-pay | Admitting: Family Medicine

## 2024-10-19 ENCOUNTER — Other Ambulatory Visit (INDEPENDENT_AMBULATORY_CARE_PROVIDER_SITE_OTHER): Payer: Medicare (Managed Care)

## 2024-10-19 DIAGNOSIS — Z794 Long term (current) use of insulin: Secondary | ICD-10-CM

## 2024-10-19 DIAGNOSIS — E119 Type 2 diabetes mellitus without complications: Secondary | ICD-10-CM

## 2024-10-19 NOTE — Progress Notes (Signed)
" ° °  10/19/24  Patient ID: Oscar Gallagher, male   DOB: August 22, 1956, 69 y.o.   MRN: [id number]  Subjective/objective Telephone follow-up visit to address control of diabetes   Diabetes management plan -Current medications: Rybelsus  7 mg daily, Xigduo  XR 01/999 mg 1 tablet twice daily -Patient was receiving Rybelsus  7mg  through [facility] [facility], but he will not receive anymore medication through program based on 2026 changes.  He still has approximately a 2 month supply of medication remaining. -Patient has been approved to resume receiving Xigduo  XR 5/1000mg  through [employer] and recently received a 90 day supply.  Medication is prescribed to take 1 tablet BID, but he will just take once daily at times due to GI upset. -Using Dexcom G7 for CGM and endorses being in range 94% of the time recently -He has not been able to refill Dexcom G7 sensors due to insurance requiring a PA- PA was denied since patient is not using daily insulin; but insurance authorized 1 more 90 day refill.  This had to be filled by [facility] based on insurance preference, but patient was able to pick up. -Does not endorse any signs or symptoms of hypo or hyperglycemia -ACEi/ARB for cardiorenal protection:  ramipril  20mg  daily -UACR of 30 December 2023 -Statin on board for ASCVD reduction:  rosuvastatin  10mg  daily  Lab Results  Component Value Date   HGBA1C 6.3 09/09/2024   HGBA1C 6.6 01/06/2024   HGBA1C 7.6 (A) 07/08/2023       Component Value Date/Time   NA 137 01/06/2024 1104   K 4.4 01/06/2024 1104   CL 100 01/06/2024 1104   CO2 23 01/06/2024 1104   GLUCOSE 92 01/06/2024 1104   GLUCOSE 180 (H) 11/05/2023 1400   BUN 23 01/06/2024 1104   CREATININE 1.21 01/06/2024 1104   CREATININE 1.23 12/05/2022 0932   CALCIUM  9.7 01/06/2024 1104   PROT 6.2 01/06/2024 1104   ALBUMIN 4.4 01/06/2024 1104   AST 26 01/06/2024 1104   ALT 15 01/06/2024 1104   ALKPHOS 38 (L) 01/06/2024 1104   BILITOT 0.5 01/06/2024 1104   EGFR 66 01/06/2024 1104    GFRNONAA >60 11/05/2023 1400       Component Value Date/Time   CHOL 125 01/06/2024 1104   TRIG 135 01/06/2024 1104   HDL 37 (L) 01/06/2024 1104   CHOLHDL 4.2 12/05/2022 0932   LDLCALC 64 01/06/2024 1104   LDLCALC 75 12/05/2022 0932   LABVLDL 24 01/06/2024 1104    Assessment/plan   Diabetes management plan -A1c, UACR, LDL, and BP at goal -Continue Rybelsus  7mg  daily and Xigduo  XR 5/1000mg  BID at this time -Applied for [employer] Extra Help during last visit, but this was denied -Test claim for Rybelsus  reflects 1st fill would be $297 due to $250 deductible, and then he would pay $47/month.  If this will not be affordable, we could try Trulicity- he should qualify for [employer] Cares PAP -Patient is working with advertising account planner and may change over to [employer]- he was told by representative they would cover Dexcom sensors even if patient is not using insulin.  Patient will notify me if he changes plan, so I can check on coverage of Dexcom and Rybelsus .   [name], PharmD, DPLA  "

## 2025-01-08 ENCOUNTER — Ambulatory Visit: Payer: Medicare (Managed Care) | Admitting: Family Medicine

## 2025-08-03 ENCOUNTER — Ambulatory Visit: Payer: Medicare (Managed Care)
